# Patient Record
Sex: Female | Born: 1965
Health system: Southern US, Community
[De-identification: ages and names within clinical notes are randomized; demographics above are authoritative.]

## PROBLEM LIST (undated history)

## (undated) DIAGNOSIS — J40 Bronchitis, not specified as acute or chronic: Secondary | ICD-10-CM

## (undated) DIAGNOSIS — J069 Acute upper respiratory infection, unspecified: Secondary | ICD-10-CM

## (undated) DIAGNOSIS — J45909 Unspecified asthma, uncomplicated: Secondary | ICD-10-CM

## (undated) DIAGNOSIS — F4310 Post-traumatic stress disorder, unspecified: Secondary | ICD-10-CM

## (undated) DIAGNOSIS — G43909 Migraine, unspecified, not intractable, without status migrainosus: Secondary | ICD-10-CM

## (undated) HISTORY — PX: ABDOMINAL HYSTERECTOMY: SHX81

## (undated) HISTORY — DX: Unspecified asthma, uncomplicated: J45.909

## (undated) HISTORY — DX: Acute upper respiratory infection, unspecified: J06.9

---

## 2013-02-10 ENCOUNTER — Encounter: Payer: Federal, State, Local not specified - PPO | Attending: Internal Medicine | Admitting: Dietician

## 2013-02-10 ENCOUNTER — Encounter: Payer: Self-pay | Admitting: Dietician

## 2013-02-10 VITALS — Ht 65.0 in | Wt 206.8 lb

## 2013-02-10 DIAGNOSIS — Z713 Dietary counseling and surveillance: Secondary | ICD-10-CM | POA: Insufficient documentation

## 2013-02-10 DIAGNOSIS — E669 Obesity, unspecified: Secondary | ICD-10-CM | POA: Insufficient documentation

## 2013-02-10 NOTE — Progress Notes (Signed)
  Medical Nutrition Therapy:  Appt start time: 0800 end time:  0900.   Assessment:  Primary concerns today: Tiffany Benson is here today since her blood sugar was elevated at her yearly Ob/gyn appointment.   Tiffany Benson is working at [employer] from 3 PM - 12 AM. and lives by herself and states that she does her grocery shopping and cooking.   Since August, Tiffany Benson has  made changes to diet such no longer drinking sweet tea, fried foods, and french fries. She states she lost two pounds since her doctor's appointment in August.   MEDICATIONS: See List    DIETARY INTAKE:  24-hr recall:  B ( AM): skips  Snk ( AM): None L ( 12 PM): water and peanut butter crackers Snk ( 3 PM): Grilled chicken wrap with water D ( 8 PM): Grilled chicken salad from [employer] with water Snk ( 12 AM): popcorn and [place] Green Tea Beverages: Water and 1 sweet green tea  Usual physical activity: walks a lot for her job  Estimated energy needs: 1800 calories 200 g carbohydrates 135 g protein 50 g fat  Progress Towards Goal(s):  In progress.   Nutritional Diagnosis:  NB-1.1 Food and nutrition-related knowledge deficit As related to history of high fat and high CHO food choices.  As evidenced by diet recall and BMI of 34.4.    Intervention:  Nutrition counseling provided. Explained the pathophysiology of insulin resistance and factors that impact blood glucose such as carbohydrate foods, exercise, stress management, and weight loss. Encouraged Tiffany Benson to continue with the dietary changes she made since August, switch her sweet green tea to a sugar free beverage, watch portion sizes, add vegetables when possible, and start exercising before work.   Handouts given during visit include:  MyPlate Handout  16X CHO Snacks  Monitoring/Evaluation:  Dietary intake, exercise, and body weight prn.

## 2013-02-10 NOTE — Patient Instructions (Addendum)
Continue having 3 meals per day and add 2-3 snacks with protein and carbs if needed. Consider switching to no-calorie sweeteners for water instead of green tea. Fill half of your plate with vegetables and limit starch to a quarter of your plate.  Aim to get 30 minutes of physical activity 5 x week. Look for ways to manage stress (if possible).

## 2013-07-11 ENCOUNTER — Ambulatory Visit (INDEPENDENT_AMBULATORY_CARE_PROVIDER_SITE_OTHER): Payer: Federal, State, Local not specified - PPO | Admitting: Internal Medicine

## 2013-07-11 ENCOUNTER — Ambulatory Visit: Payer: Federal, State, Local not specified - PPO

## 2013-07-11 ENCOUNTER — Encounter: Payer: Self-pay | Admitting: Emergency Medicine

## 2013-07-11 VITALS — BP 140/90 | HR 110 | Temp 98.0°F | Resp 20 | Ht 65.5 in | Wt 205.0 lb

## 2013-07-11 DIAGNOSIS — J45901 Unspecified asthma with (acute) exacerbation: Secondary | ICD-10-CM

## 2013-07-11 DIAGNOSIS — J988 Other specified respiratory disorders: Secondary | ICD-10-CM

## 2013-07-11 DIAGNOSIS — R0602 Shortness of breath: Secondary | ICD-10-CM

## 2013-07-11 DIAGNOSIS — J22 Unspecified acute lower respiratory infection: Secondary | ICD-10-CM

## 2013-07-11 MED ORDER — AZITHROMYCIN 250 MG PO TABS: ORAL_TABLET | ORAL | Status: DC

## 2013-07-11 MED ORDER — IPRATROPIUM BROMIDE 0.02 % IN SOLN
0.5000 mg | Freq: Once | RESPIRATORY_TRACT | Status: AC
  Administered 2013-07-11: 0.5 mg via RESPIRATORY_TRACT

## 2013-07-11 MED ORDER — ALBUTEROL SULFATE (2.5 MG/3ML) 0.083% IN NEBU
2.5000 mg | INHALATION_SOLUTION | Freq: Once | RESPIRATORY_TRACT | Status: AC
  Administered 2013-07-11: 2.5 mg via RESPIRATORY_TRACT

## 2013-07-11 MED ORDER — PREDNISONE 20 MG PO TABS: 40.0000 mg | ORAL_TABLET | Freq: Every day | ORAL | Status: DC

## 2013-07-11 NOTE — Progress Notes (Signed)
   Subjective:    Patient ID: Tiffany Benson, female    DOB: 01/18/1966, 48 y.o.   MRN: [id number]  HPI 48 yo female with complaint of shortness of breath.  Onset today.  Positive recent URI/cough symptoms for past 3-4 days.  She has history of adult onset asthma (2006) currently on advair and albuterol.  Used meds this am, but no albuteral since that time.  No fever or chills.  Cough productive with greenish/yellow sputum.  No chest pain.  PPMH:  Asthma  SH:  Nonsmoker, no alcohol  FH:  Noncontributory.   Review of Systems  Constitutional: Negative for fever and chills.  HENT: Positive for sore throat. Negative for rhinorrhea.   Respiratory: Positive for cough, shortness of breath and wheezing. Negative for chest tightness and stridor.   Cardiovascular: Negative for chest pain, palpitations and leg swelling.  Gastrointestinal: Negative for nausea, vomiting, diarrhea and constipation.  Musculoskeletal: Negative for back pain.  Neurological: Negative for syncope, weakness, numbness and headaches.       Objective:   Physical Exam Blood pressure 140/90, pulse 110, temperature 98 F (36.7 C), temperature source Oral, resp. rate 20, height 5' 5.5" (1.664 m), weight 205 lb (92.987 kg), SpO2 99.00%. Body mass index is 33.58 kg/(m^2). Well-developed, well nourished female who is awake, alert and oriented, in mild distress. HEENT: [place]/AT, PERRL, EOMI.  Sclera and conjunctiva are clear.  Nasal mucosa is pink and moist. OP is clear. Neck: supple, non-tender, no lymphadenopathy, thyromegaly. Heart: tachycardic, no murmur Lungs: labored breathing with expiratory wheezing. Abdomen: normo-active bowel sounds, supple, non-tender, no mass or organomegaly. Extremities: no cyanosis, clubbing or edema. Skin: warm and dry without rash. Psychologic: good mood and appropriate affect, normal speech and behavior.  Chest xray negative EKG:  Mild tachycardia (done after neb), normal axis, normal  intervals, nonspecific st t changes., no acute ischemia     Assessment & Plan:  Patient given albuterol/atrovent neb on arrival. Re-eval and wheezing resolved after neb.  Patient feeling better.    Asthmatic bronchitis with lower respiratory tract infection  D/C on Zpack and 5 days of prednisone.   I have reviewed this patient presentation and the x-ray and agree with plan and documentation. Robert P. Merla Richesoolittle, M.D.

## 2013-07-11 NOTE — Patient Instructions (Signed)

## 2013-09-27 ENCOUNTER — Emergency Department (HOSPITAL_BASED_OUTPATIENT_CLINIC_OR_DEPARTMENT_OTHER)
Admission: EM | Admit: 2013-09-27 | Discharge: 2013-09-27 | Disposition: A | Discharge status: Home / Self Care | Attending: Emergency Medicine | Admitting: Emergency Medicine

## 2013-09-27 ENCOUNTER — Encounter (HOSPITAL_BASED_OUTPATIENT_CLINIC_OR_DEPARTMENT_OTHER): Payer: Self-pay | Admitting: Emergency Medicine

## 2013-09-27 DIAGNOSIS — S335XXA Sprain of ligaments of lumbar spine, initial encounter: Secondary | ICD-10-CM | POA: Diagnosis not present

## 2013-09-27 DIAGNOSIS — S239XXA Sprain of unspecified parts of thorax, initial encounter: Secondary | ICD-10-CM | POA: Insufficient documentation

## 2013-09-27 DIAGNOSIS — J45909 Unspecified asthma, uncomplicated: Secondary | ICD-10-CM | POA: Diagnosis not present

## 2013-09-27 DIAGNOSIS — Z79899 Other long term (current) drug therapy: Secondary | ICD-10-CM | POA: Insufficient documentation

## 2013-09-27 DIAGNOSIS — X500XXA Overexertion from strenuous movement or load, initial encounter: Secondary | ICD-10-CM | POA: Insufficient documentation

## 2013-09-27 DIAGNOSIS — IMO0002 Reserved for concepts with insufficient information to code with codable children: Secondary | ICD-10-CM | POA: Diagnosis not present

## 2013-09-27 DIAGNOSIS — Y990 Civilian activity done for income or pay: Secondary | ICD-10-CM | POA: Diagnosis not present

## 2013-09-27 DIAGNOSIS — Y9389 Activity, other specified: Secondary | ICD-10-CM | POA: Diagnosis not present

## 2013-09-27 DIAGNOSIS — Y9289 Other specified places as the place of occurrence of the external cause: Secondary | ICD-10-CM | POA: Diagnosis not present

## 2013-09-27 DIAGNOSIS — S39012A Strain of muscle, fascia and tendon of lower back, initial encounter: Secondary | ICD-10-CM

## 2013-09-27 MED ORDER — KETOROLAC TROMETHAMINE 60 MG/2ML IM SOLN
60.0000 mg | Freq: Once | INTRAMUSCULAR | Status: AC
  Administered 2013-09-27: 60 mg via INTRAMUSCULAR
  Filled 2013-09-27: qty 2

## 2013-09-27 MED ORDER — HYDROCODONE-ACETAMINOPHEN 5-325 MG PO TABS: 1.0000 | ORAL_TABLET | ORAL | Status: DC | PRN

## 2013-09-27 NOTE — ED Notes (Signed)
C/o of mid back pain after lifting something at work,   [place] 2 ibu but no relief

## 2013-09-27 NOTE — Discharge Instructions (Signed)
Ibuprofen 600 mg 3 times daily for the next 5 days. Hydrocodone as needed for pain not relieved with ibuprofen.  Followup with your primary Dr. if not improving in the next week, and return to the ER if your pain significantly worsens or changes in nature.   Back Pain, Adult Low back pain is very common. About 1 in 5 people have back pain.The cause of low back pain is rarely dangerous. The pain often gets better over time.About half of people with a sudden onset of back pain feel better in just 2 weeks. About 8 in 10 people feel better by 6 weeks.  CAUSES Some common causes of back pain include:  Strain of the muscles or ligaments supporting the spine.  Wear and tear (degeneration) of the spinal discs.  Arthritis.  Direct injury to the back. DIAGNOSIS Most of the time, the direct cause of low back pain is not known.However, back pain can be treated effectively even when the exact cause of the pain is unknown.Answering your caregiver's questions about your overall health and symptoms is one of the most accurate ways to make sure the cause of your pain is not dangerous. If your caregiver needs more information, he or she may order lab work or imaging tests (X-rays or MRIs).However, even if imaging tests show changes in your back, this usually does not require surgery. HOME CARE INSTRUCTIONS For many people, back pain returns.Since low back pain is rarely dangerous, it is often a condition that people can learn to [place] their own.   Remain active. It is stressful on the back to sit or stand in one place. Do not sit, drive, or stand in one place for more than 30 minutes at a time. Take short walks on level surfaces as soon as pain allows.Try to increase the length of time you walk each day.  Do not stay in bed.Resting more than 1 or 2 days can delay your recovery.  Do not avoid exercise or work.Your body is made to move.It is not dangerous to be active, even though your back may  hurt.Your back will likely heal faster if you return to being active before your pain is gone.  Pay attention to your body when you bend and lift. Many people have less discomfortwhen lifting if they bend their knees, keep the load close to their bodies,and avoid twisting. Often, the most comfortable positions are those that put less stress on your recovering back.  Find a comfortable position to sleep. Use a firm mattress and lie on your side with your knees slightly bent. If you lie on your back, put a pillow under your knees.  Only take over-the-counter or prescription medicines as directed by your caregiver. Over-the-counter medicines to reduce pain and inflammation are often the most helpful.Your caregiver may prescribe muscle relaxant drugs.These medicines help dull your pain so you can more quickly return to your normal activities and healthy exercise.  Put ice on the injured area.  Put ice in a plastic bag.  Place a towel between your skin and the bag.  Leave the ice on for 15-20 minutes, 03-04 times a day for the first 2 to 3 days. After that, ice and heat may be alternated to reduce pain and spasms.  Ask your caregiver about trying back exercises and gentle massage. This may be of some benefit.  Avoid feeling anxious or stressed.Stress increases muscle tension and can worsen back pain.It is important to recognize when you are anxious or stressed and  learn ways to manage it.Exercise is a great option. SEEK MEDICAL CARE IF:  You have pain that is not relieved with rest or medicine.  You have pain that does not improve in 1 week.  You have new symptoms.  You are generally not feeling well. SEEK IMMEDIATE MEDICAL CARE IF:   You have pain that radiates from your back into your legs.  You develop new bowel or bladder control problems.  You have unusual weakness or numbness in your arms or legs.  You develop nausea or vomiting.  You develop abdominal pain.  You  feel faint. Document Released: 05/18/2005 Document Revised: 11/17/2011 Document Reviewed: 10/06/2010 [facility] Patient Information 2014 [place], [place].

## 2013-09-27 NOTE — ED Provider Notes (Signed)
CSN: [id number]     Arrival date & time 09/27/13  1910 History  This chart was scribed for Geoffery Lyonsouglas Christion Leonhard, MD by Luisa DagoPriscilla Tutu, ED Scribe. This patient was seen in room MH11/MH11 and the patient's care was started at 7:40 PM.    Chief Complaint  Patient presents with  . Back Pain   The history is provided by the patient. No language interpreter was used.   HPI Comments: Tiffany Benson is a 48 y.o. female with history of asthma and seasonal allergies, presents to the Emergency Department complaining of worsening sudden onset back pain that started about 3 hours ago. Pt states that she was picking up heavy items at work when she started experiencing back pain. Pt is concerned that she may have pulled a muscle. Mrs. Tiffany Benson says that the pain is exacerbated by bending down or twisting. She denies any history of back pain. Pt denies any pain radiating down her legs. She denies any saddle paraesthesia, numbness, weakness, SOB, bladder or bowel incontinence.   Pt works for the [employer].   Past Medical History  Diagnosis Date  . Asthma    History reviewed. No pertinent past surgical history. Family History  Problem Relation Age of Onset  . Hypertension Other    History  Substance Use Topics  . Smoking status: Never Smoker   . Smokeless tobacco: Not on file  . Alcohol Use: No   OB History   Grav Para Term Preterm Abortions TAB SAB Ect Mult Living                 Review of Systems A complete 10 system review of systems was obtained and all systems are negative except as noted in the HPI and PMH.     Allergies  Review of patient's allergies indicates no known allergies.  Home Medications   Prior to Admission medications   Medication Sig Start Date End Date Taking? Authorizing Provider  albuterol (PROVENTIL HFA;VENTOLIN HFA) 108 (90 BASE) MCG/ACT inhaler Inhale 2 puffs into the lungs every 6 (six) hours as needed for wheezing.    Historical Provider, MD  azithromycin  (ZITHROMAX) 250 MG tablet Take 2 tabs PO x 1 dose, then 1 tab PO QD x 4 days 07/11/13   Tarry Kosodd M McGrath, MD  Fluticasone-Salmeterol (ADVAIR) 100-50 MCG/DOSE AEPB Inhale 1 puff into the lungs every 12 (twelve) hours.    Historical Provider, MD  predniSONE (DELTASONE) 20 MG tablet Take 2 tablets (40 mg total) by mouth daily with breakfast. 07/11/13   Tarry Kosodd M McGrath, MD   BP 136/87  Pulse 93  Temp(Src) 97.2 F (36.2 C) (Oral)  Resp 18  Ht 5\' 3"  (1.6 m)  Wt 203 lb (92.08 kg)  BMI 35.97 kg/m2  SpO2 98%  Physical Exam  Nursing note and vitals reviewed. Constitutional: She appears well-developed and well-nourished. No distress.  HENT:  Head: Normocephalic and atraumatic.  Eyes: Conjunctivae are normal. Right eye exhibits no discharge. Left eye exhibits no discharge.  Neck: Neck supple.  Cardiovascular: Normal rate, regular rhythm and normal heart sounds.  Exam reveals no gallop and no friction rub.   No murmur heard. Pulmonary/Chest: Effort normal and breath sounds normal. No respiratory distress.  Abdominal: Soft. She exhibits no distension. There is no tenderness.  Musculoskeletal: She exhibits no edema and no tenderness.  TTP in the soft tissues of the upper lumbar/ lower thoracic region. There is no bony tenderness or step-offs.  Neurological: She is alert.  DTRs normal  and symmetric. Equal grip strength bilateral with 5/5 strength against resistance in upper and lower extremities. No sensory or motor deficits appreciated. Able to walk on heels and toes without difficulty.    Skin: Skin is warm and dry.  Psychiatric: She has a normal mood and affect. Her behavior is normal. Thought content normal.    ED Course  Procedures (including critical care time)  DIAGNOSTIC STUDIES: Oxygen Saturation is 98% on RA, normal by my interpretation.    COORDINATION OF CARE: 7:47 PM- Will prescribe antiinflammatory medication. Will also prescribe pain medication. Pt advised of plan for treatment and  pt agrees. Medications  ketorolac (TORADOL) injection 60 mg (not administered)    Labs Review Labs Reviewed - No data to display  Imaging Review No results found.   EKG Interpretation None      MDM   Final diagnoses:  None    Patient is a 48 year old female who presents with complaints of pain in the mid back that started abruptly 3 hours ago while lifting a heavy item at work. She works for the [employer]. She denies radiation into the legs and denies any bowel or bladder complaints. Her neurologic exam is nonfocal, strength is symmetrical, and she is able to ambulate without difficulty. She will be discharged home with anti-inflammatories, pain medication and when necessary followup if she is not improving in the next week.  I personally performed the services described in this documentation, which was scribed in my presence. The recorded information has been reviewed and is accurate.    Geoffery Lyonsouglas Benicia Bergevin, MD 09/27/13 [id number]

## 2013-09-27 NOTE — ED Notes (Signed)
Pt c/o picking up heavy items at work and suddenly got mid back pain x 3 hrs ago

## 2014-01-11 ENCOUNTER — Other Ambulatory Visit (HOSPITAL_COMMUNITY)
Admission: RE | Admit: 2014-01-11 | Discharge: 2014-01-11 | Disposition: A | Discharge status: Home / Self Care | Payer: Federal, State, Local not specified - PPO | Source: Ambulatory Visit | Attending: Obstetrics and Gynecology | Admitting: Obstetrics and Gynecology

## 2014-01-11 ENCOUNTER — Other Ambulatory Visit: Payer: Self-pay | Admitting: Obstetrics and Gynecology

## 2014-01-11 DIAGNOSIS — Z1151 Encounter for screening for human papillomavirus (HPV): Secondary | ICD-10-CM | POA: Insufficient documentation

## 2014-01-11 DIAGNOSIS — Z01419 Encounter for gynecological examination (general) (routine) without abnormal findings: Secondary | ICD-10-CM | POA: Insufficient documentation

## 2014-01-15 LAB — CYTOLOGY - PAP

## 2014-06-13 ENCOUNTER — Ambulatory Visit (HOSPITAL_COMMUNITY)
Admission: RE | Admit: 2014-06-13 | Payer: Federal, State, Local not specified - PPO | Source: Ambulatory Visit | Admitting: Obstetrics and Gynecology

## 2014-06-13 ENCOUNTER — Encounter (HOSPITAL_COMMUNITY): Admission: RE | Payer: Self-pay | Source: Ambulatory Visit

## 2014-06-13 SURGERY — ROBOTIC ASSISTED TOTAL HYSTERECTOMY WITH BILATERAL SALPINGO OOPHORECTOMY
Anesthesia: Choice | Laterality: Bilateral

## 2015-01-21 ENCOUNTER — Other Ambulatory Visit (HOSPITAL_COMMUNITY): Payer: Self-pay

## 2015-01-30 ENCOUNTER — Encounter (HOSPITAL_COMMUNITY): Admission: RE | Payer: Self-pay | Source: Ambulatory Visit

## 2015-01-30 ENCOUNTER — Ambulatory Visit (HOSPITAL_COMMUNITY)
Admission: RE | Admit: 2015-01-30 | Payer: Federal, State, Local not specified - PPO | Source: Ambulatory Visit | Admitting: Obstetrics and Gynecology

## 2015-01-30 SURGERY — ROBOTIC ASSISTED TOTAL HYSTERECTOMY WITH BILATERAL SALPINGO OOPHORECTOMY
Anesthesia: Choice | Laterality: Bilateral

## 2016-02-21 ENCOUNTER — Encounter (HOSPITAL_BASED_OUTPATIENT_CLINIC_OR_DEPARTMENT_OTHER): Payer: Self-pay

## 2016-02-21 ENCOUNTER — Emergency Department (HOSPITAL_BASED_OUTPATIENT_CLINIC_OR_DEPARTMENT_OTHER): Payer: Federal, State, Local not specified - PPO

## 2016-02-21 ENCOUNTER — Emergency Department (HOSPITAL_BASED_OUTPATIENT_CLINIC_OR_DEPARTMENT_OTHER)
Admission: EM | Admit: 2016-02-21 | Discharge: 2016-02-21 | Disposition: A | Discharge status: Home / Self Care | Payer: Federal, State, Local not specified - PPO | Attending: Emergency Medicine | Admitting: Emergency Medicine

## 2016-02-21 DIAGNOSIS — Y92481 Parking lot as the place of occurrence of the external cause: Secondary | ICD-10-CM | POA: Insufficient documentation

## 2016-02-21 DIAGNOSIS — Z23 Encounter for immunization: Secondary | ICD-10-CM | POA: Insufficient documentation

## 2016-02-21 DIAGNOSIS — S20211A Contusion of right front wall of thorax, initial encounter: Secondary | ICD-10-CM | POA: Diagnosis not present

## 2016-02-21 DIAGNOSIS — Y999 Unspecified external cause status: Secondary | ICD-10-CM | POA: Insufficient documentation

## 2016-02-21 DIAGNOSIS — J45909 Unspecified asthma, uncomplicated: Secondary | ICD-10-CM | POA: Diagnosis not present

## 2016-02-21 DIAGNOSIS — Y939 Activity, unspecified: Secondary | ICD-10-CM | POA: Insufficient documentation

## 2016-02-21 DIAGNOSIS — S299XXA Unspecified injury of thorax, initial encounter: Secondary | ICD-10-CM | POA: Diagnosis present

## 2016-02-21 MED ORDER — TETANUS-DIPHTH-ACELL PERTUSSIS 5-2.5-18.5 LF-MCG/0.5 IM SUSP
0.5000 mL | Freq: Once | INTRAMUSCULAR | Status: AC
  Administered 2016-02-21: 0.5 mL via INTRAMUSCULAR
  Filled 2016-02-21: qty 0.5
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
## 2016-02-21 NOTE — ED Notes (Signed)
Patient complains of pain in chest with movement and coughing.  Denies SOB or difficulty breathing.  Complains of right knee pain with mild swelling noted.  Patient is weight bearing without difficulty.  NAD.

## 2016-02-21 NOTE — ED Notes (Signed)
Pt denies wheelchair at triage
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
## 2016-08-31 ENCOUNTER — Encounter (HOSPITAL_BASED_OUTPATIENT_CLINIC_OR_DEPARTMENT_OTHER): Payer: Self-pay | Admitting: Emergency Medicine

## 2016-08-31 ENCOUNTER — Emergency Department (HOSPITAL_BASED_OUTPATIENT_CLINIC_OR_DEPARTMENT_OTHER)
Admission: EM | Admit: 2016-08-31 | Discharge: 2016-09-01 | Disposition: A | Discharge status: Home / Self Care | Payer: Federal, State, Local not specified - PPO | Attending: Emergency Medicine | Admitting: Emergency Medicine

## 2016-08-31 ENCOUNTER — Emergency Department (HOSPITAL_BASED_OUTPATIENT_CLINIC_OR_DEPARTMENT_OTHER): Payer: Federal, State, Local not specified - PPO

## 2016-08-31 DIAGNOSIS — B9789 Other viral agents as the cause of diseases classified elsewhere: Secondary | ICD-10-CM

## 2016-08-31 DIAGNOSIS — G43009 Migraine without aura, not intractable, without status migrainosus: Secondary | ICD-10-CM

## 2016-08-31 DIAGNOSIS — R0602 Shortness of breath: Secondary | ICD-10-CM

## 2016-08-31 DIAGNOSIS — J45909 Unspecified asthma, uncomplicated: Secondary | ICD-10-CM | POA: Diagnosis not present

## 2016-08-31 DIAGNOSIS — J069 Acute upper respiratory infection, unspecified: Secondary | ICD-10-CM

## 2016-08-31 DIAGNOSIS — G43909 Migraine, unspecified, not intractable, without status migrainosus: Secondary | ICD-10-CM | POA: Insufficient documentation

## 2016-08-31 MED ORDER — METOCLOPRAMIDE HCL 5 MG/ML IJ SOLN
10.0000 mg | Freq: Once | INTRAMUSCULAR | Status: AC
Start: 2016-08-31 — End: 2016-08-31
  Administered 2016-08-31: 10 mg via INTRAVENOUS
  Filled 2016-08-31: qty 2

## 2016-08-31 MED ORDER — DIPHENHYDRAMINE HCL 50 MG/ML IJ SOLN
25.0000 mg | Freq: Once | INTRAMUSCULAR | Status: AC
  Administered 2016-08-31: 25 mg via INTRAVENOUS
  Filled 2016-08-31: qty 1

## 2016-08-31 MED ORDER — DEXAMETHASONE SODIUM PHOSPHATE 10 MG/ML IJ SOLN
10.0000 mg | Freq: Once | INTRAMUSCULAR | Status: AC
  Administered 2016-08-31: 10 mg via INTRAVENOUS
  Filled 2016-08-31: qty 1

## 2016-08-31 MED ORDER — KETOROLAC TROMETHAMINE 30 MG/ML IJ SOLN
30.0000 mg | Freq: Once | INTRAMUSCULAR | Status: AC
  Administered 2016-08-31: 30 mg via INTRAVENOUS
  Filled 2016-08-31: qty 1

## 2016-08-31 MED ORDER — SODIUM CHLORIDE 0.9 % IV BOLUS (SEPSIS)
1000.0000 mL | Freq: Once | INTRAVENOUS | Status: AC
  Administered 2016-08-31: 1000 mL via INTRAVENOUS

## 2016-08-31 NOTE — ED Notes (Addendum)
Pt states she feels short of breath that started around 1400 this date. Pt reports a hx of asthma and used her inhaler but states it didn't do her any good. Pt reports that she feels as though she has nasal congestion.

## 2016-08-31 NOTE — ED Notes (Signed)
Patient transported to X-ray 

## 2016-08-31 NOTE — ED Provider Notes (Signed)
[facility] Provider Note   CSN: [id number] Arrival date & time: 08/31/16  1825  By signing my name below, I, Linna Darner, attest that this documentation has been prepared under the direction and in the presence of physician practitioner, Lyndal Pulley, MD. Electronically Signed: Linna Darner, Scribe. 08/31/2016. 9:20 PM.  History   Chief Complaint Chief Complaint  Patient presents with  . Migraine  . Shortness of Breath    The history is provided by the patient. No language interpreter was used.  Shortness of Breath  This is a recurrent problem. The average episode lasts 7 hours. The problem occurs frequently.The current episode started 6 to 12 hours ago. The problem has not changed since onset.Associated symptoms include headaches, cough and chest pain (secondary to cough). Pertinent negatives include no sore throat, no sputum production, no hemoptysis, no vomiting and no abdominal pain. It is unknown what precipitated the problem. She has tried inhaled steroids for the symptoms. The treatment provided no relief. Associated medical issues include asthma. Associated medical issues do not include COPD, pneumonia, chronic lung disease, PE, CAD, heart failure, past MI, DVT or recent surgery.     HPI Comments: Tiffany Benson is a 51 y.o. female with PMHx of asthma who presents to the Emergency Department complaining of persistent shortness of breath beginning this afternoon. Pt notes she has had a gradually worsening non-productive cough for a few days along with chest pain secondary to coughing. She states her symptoms are consistent with past asthma exacerbations. Pt has used her inhaler with no improvement of her symptoms. She has also tried some OTC medications for her cough with no relief. She states she came here to receive a breathing treatment which has provided good relief of her asthma exacerbations in the past. She denies congestion, sore throat, or any other associated  symptoms.  She is also complaining of a constant headache beginning this afternoon. She endorses associated photophobia. Pt states her symptoms are consistent with her h/o chronic migraines. No medications or treatments tried. She denies vision changes, nausea, vomiting, or any other associated symptoms.  Past Medical History:  Diagnosis Date  . Asthma     There are no active problems to display for this patient.   Past Surgical History:  Procedure Laterality Date  . ABDOMINAL HYSTERECTOMY      OB History    No data available       Home Medications    Prior to Admission medications   Medication Sig Start Date End Date Taking? Authorizing Provider  acetaminophen (TYLENOL) 500 MG tablet Take 500 mg by mouth every 6 (six) hours as needed (cramps).    Historical Provider, MD  albuterol (PROVENTIL HFA;VENTOLIN HFA) 108 (90 BASE) MCG/ACT inhaler Inhale 2 puffs into the lungs every 6 (six) hours as needed for wheezing.    Historical Provider, MD  Fe Fum-FePoly-Vit C-Vit B3 (INTEGRA) 62.5-62.5-40-3 MG CAPS Take 1 capsule by mouth 2 (two) times daily.    Historical Provider, MD  zolpidem (AMBIEN) 10 MG tablet Take 10 mg by mouth at bedtime as needed for sleep.    Historical Provider, MD    Family History Family History  Problem Relation Age of Onset  . Hypertension Other     Social History Social History  Substance Use Topics  . Smoking status: Never Smoker  . Smokeless tobacco: Never Used  . Alcohol use No     Allergies   Patient has no known allergies.   Review of Systems Review  of Systems  HENT: Negative for congestion and sore throat.   Eyes: Negative for visual disturbance.  Respiratory: Positive for cough and shortness of breath. Negative for hemoptysis and sputum production.   Cardiovascular: Positive for chest pain (secondary to cough).  Gastrointestinal: Negative for abdominal pain, nausea and vomiting.  Neurological: Positive for headaches.  All other  systems reviewed and are negative.  Physical Exam Updated Vital Signs BP 136/74   Pulse (!) 102   Temp 99 F (37.2 C) (Oral)   Resp (!) 24   Ht  (1.651 m)   Wt 215 lb (97.5 kg)   SpO2 100%   BMI 35.78 kg/m   Physical Exam  Constitutional: She is oriented to person, place, and time. She appears well-developed and well-nourished. No distress.  HENT:  Head: Normocephalic.  Nose: Nose normal.  Eyes: Conjunctivae are normal.  Neck: Neck supple. No tracheal deviation present.  Cardiovascular: Normal rate, regular rhythm and normal heart sounds.  Exam reveals no gallop and no friction rub.   No murmur heard. Pulmonary/Chest: Effort normal and breath sounds normal. No respiratory distress. She has no decreased breath sounds. She has no wheezes. She has no rhonchi. She has no rales.  Abdominal: Soft. She exhibits no distension.  Neurological: She is alert and oriented to person, place, and time.  Skin: Skin is warm and dry.  Psychiatric: She has a normal mood and affect.   ED Treatments / Results  Labs (all labs ordered are listed, but only abnormal results are displayed) Labs Reviewed - No data to display  EKG  EKG Interpretation None       Radiology Dg Chest 2 View  Result Date: 08/31/2016 CLINICAL DATA:  Shortness of breath for 1 day EXAM: CHEST  2 VIEW COMPARISON:  February 21, 2016 FINDINGS: The heart size and mediastinal contours are within normal limits. Both lungs are clear. The visualized skeletal structures are unremarkable. IMPRESSION: No active cardiopulmonary disease. Electronically Signed   By: Sherian Rein M.D.   On: 08/31/2016 18:56    Procedures Procedures (including critical care time)  DIAGNOSTIC STUDIES: Oxygen Saturation is 100% on RA, normal by my interpretation.    COORDINATION OF CARE: 9:27 PM Discussed treatment plan with pt at bedside and pt agreed to plan.  Medications Ordered in ED Medications  ketorolac (TORADOL) 30 MG/ML injection  30 mg (30 mg Intravenous Given 08/31/16 2152)  metoCLOPramide (REGLAN) injection 10 mg (10 mg Intravenous Given 08/31/16 2152)  diphenhydrAMINE (BENADRYL) injection 25 mg (25 mg Intravenous Given 08/31/16 2152)  sodium chloride 0.9 % bolus 1,000 mL (0 mLs Intravenous Stopped 08/31/16 2315)  dexamethasone (DECADRON) injection 10 mg (10 mg Intravenous Given 08/31/16 2152)     Initial Impression / Assessment and Plan / ED Course  I have reviewed the triage vital signs and the nursing notes.  Pertinent labs & imaging results that were available during my care of the patient were reviewed by me and considered in my medical decision making (see chart for details).     51 y.o. female presents with c/o typical migraine symptoms and ongoing cough over the last 3-4 days. Cough has precipitated a sternal pain that is worse when coughing. Migraine has followed what appears to be viral URI. She was treated with migraine cocktail with improvement of symptoms and was able to rest comfortably. Has no adventitious lung sounds and negative CXR. Plan to follow up with PCP as needed and return precautions discussed for worsening or new concerning  symptoms.   Final Clinical Impressions(s) / ED Diagnoses   Final diagnoses:  Migraine without aura and without status migrainosus, not intractable  Shortness of breath  Viral URI with cough    New Prescriptions New Prescriptions   No medications on file   I personally performed the services described in this documentation, which was scribed in my presence. The recorded information has been reviewed and is accurate.     Lyndal Pulley, MD 09/01/16 [phone]

## 2016-08-31 NOTE — ED Triage Notes (Signed)
Pt c/o SHOB, onset today; hx of asthma.

## 2016-09-01 NOTE — Discharge Instructions (Signed)

## 2016-09-01 NOTE — ED Provider Notes (Signed)
Blood pressure 121/87, pulse 77, temperature 99 F (37.2 C), temperature source Oral, resp. rate 18, height  (1.651 m), weight 215 lb (97.5 kg), SpO2 100 %.  Assuming care from Dr. Clydene Pugh.  In short, Tiffany Benson is a 51 y.o. female with a chief complaint of Migraine and Shortness of Breath .  Refer to the original H&P for additional details.  The current plan of care is to follow up after medicaiton.  01:31 AM Patient is feeling much better. No HA or SOB currently. Will discharge with PCP follow up and plan for NSAIDs OTC.   At this time, I do not feel there is any life-threatening condition present. I have reviewed and discussed all results (EKG, imaging, lab, urine as appropriate), exam findings with patient. I have reviewed nursing notes and appropriate previous records.  I feel the patient is safe to be discharged home without further emergent workup. Discussed usual and customary return precautions. Patient and family (if present) verbalize understanding and are comfortable with this plan.  Patient will follow-up with their primary care provider. If they do not have a primary care provider, information for follow-up has been provided to them. All questions have been answered.  Alona Bene, MD   Maia Plan, MD 09/01/16 [phone]

## 2018-07-08 ENCOUNTER — Emergency Department (HOSPITAL_BASED_OUTPATIENT_CLINIC_OR_DEPARTMENT_OTHER)
Admission: EM | Admit: 2018-07-08 | Discharge: 2018-07-08 | Disposition: A | Discharge status: Home / Self Care | Payer: Federal, State, Local not specified - PPO | Attending: Emergency Medicine | Admitting: Emergency Medicine

## 2018-07-08 ENCOUNTER — Other Ambulatory Visit: Payer: Self-pay

## 2018-07-08 ENCOUNTER — Encounter (HOSPITAL_BASED_OUTPATIENT_CLINIC_OR_DEPARTMENT_OTHER): Payer: Self-pay | Admitting: Adult Health

## 2018-07-08 DIAGNOSIS — M549 Dorsalgia, unspecified: Secondary | ICD-10-CM | POA: Insufficient documentation

## 2018-07-08 DIAGNOSIS — J45909 Unspecified asthma, uncomplicated: Secondary | ICD-10-CM | POA: Diagnosis not present

## 2018-07-08 DIAGNOSIS — Z79899 Other long term (current) drug therapy: Secondary | ICD-10-CM | POA: Diagnosis not present

## 2018-07-08 DIAGNOSIS — M545 Low back pain: Secondary | ICD-10-CM | POA: Diagnosis not present

## 2018-07-08 HISTORY — DX: Bronchitis, not specified as acute or chronic: J40

## 2018-07-08 HISTORY — DX: Migraine, unspecified, not intractable, without status migrainosus: G43.909

## 2018-07-08 HISTORY — DX: Post-traumatic stress disorder, unspecified: F43.10

## 2018-07-08 LAB — URINALYSIS, ROUTINE W REFLEX MICROSCOPIC
Bilirubin Urine: NEGATIVE
Glucose, UA: NEGATIVE mg/dL
KETONES UR: NEGATIVE mg/dL
Leukocytes, UA: NEGATIVE
NITRITE: NEGATIVE
PROTEIN: NEGATIVE mg/dL
Specific Gravity, Urine: 1.025 (ref 1.005–1.030)
pH: 6 (ref 5.0–8.0)

## 2018-07-08 LAB — URINALYSIS, MICROSCOPIC (REFLEX)

## 2018-07-08 MED ORDER — METHOCARBAMOL 500 MG PO TABS: 1000.0000 mg | ORAL_TABLET | Freq: Four times a day (QID) | ORAL | 0 refills | Status: AC

## 2018-07-08 MED ORDER — NAPROXEN 500 MG PO TABS: 500.0000 mg | ORAL_TABLET | Freq: Two times a day (BID) | ORAL | 0 refills | Status: AC

## 2018-07-08 MED ORDER — KETOROLAC TROMETHAMINE 15 MG/ML IJ SOLN
15.0000 mg | Freq: Once | INTRAMUSCULAR | Status: AC
  Administered 2018-07-08: 15 mg via INTRAMUSCULAR
  Filled 2018-07-08: qty 1

## 2018-07-08 MED FILL — NAPROXEN 500 MG TABLET: 500 | 10 days supply | Qty: 20 | Fill #0

## 2018-07-08 MED FILL — METHOCARBAMOL 500 MG TABLET: 500 | 3 days supply | Qty: 20 | Fill #0

## 2018-07-08 NOTE — ED Notes (Signed)
ED Provider at bedside. 

## 2018-07-08 NOTE — ED Provider Notes (Signed)
[facility] EMERGENCY DEPARTMENT Provider Note   CSN: [id number] Arrival date & time: 07/08/18  0935     History   Chief Complaint Chief Complaint  Patient presents with  . Flank Pain    HPI Tiffany Benson is a 53 y.o. female.  Patient presents with 2-day history of back pain.  Patient states that the pain is in the right middle to lower back.  Pain is worse with palpation and with bending forward.  Patient works for the [employer].  She was taking Tylenol for the pain at first however it has since worsened.  She had difficulty sleeping last night due to the pain.  She denies associated fevers, nausea, vomiting, or diarrhea.  No chest pain or shortness of breath.  No abdominal pain.  She denies any urinary symptoms including dysuria, hematuria, increased frequency urgency.  She does not have history of kidney stones.  The pain does not radiate.  The pain does not travel into her legs. Patient denies warning symptoms of back pain including: fecal incontinence, urinary retention or overflow incontinence, night sweats, waking from sleep with back pain, unexplained fevers or weight loss, h/o cancer, IVDU, recent trauma.        Past Medical History:  Diagnosis Date  . Asthma   . Bronchitis   . Migraines   . PTSD (post-traumatic stress disorder)     There are no active problems to display for this patient.   Past Surgical History:  Procedure Laterality Date  . ABDOMINAL HYSTERECTOMY       OB History   No obstetric history on file.      Home Medications    Prior to Admission medications   Medication Sig Start Date End Date Taking? Authorizing Provider  clonazePAM (KLONOPIN) 2 MG tablet  11/20/16  Yes [provider]  pantoprazole (PROTONIX) 20 MG tablet Take by mouth. 11/06/16  Yes [provider]  acetaminophen (TYLENOL) 500 MG tablet Take 500 mg by mouth every 6 (six) hours as needed (cramps).    [provider]  albuterol  (PROVENTIL HFA;VENTOLIN HFA) 108 (90 BASE) MCG/ACT inhaler Inhale 2 puffs into the lungs every 6 (six) hours as needed for wheezing.    [provider]  Fe Fum-FePoly-Vit C-Vit B3 (INTEGRA) 62.5-62.5-40-3 MG CAPS Take 1 capsule by mouth 2 (two) times daily.    [provider]  zolpidem (AMBIEN) 10 MG tablet Take 10 mg by mouth at bedtime as needed for sleep.    [provider]    Family History Family History  Problem Relation Age of Onset  . Hypertension Other     Social History Social History   Tobacco Use  . Smoking status: Never Smoker  . Smokeless tobacco: Never Used  Substance Use Topics  . Alcohol use: No  . Drug use: No     Allergies   Patient has no known allergies.   Review of Systems Review of Systems  Constitutional: Negative for fever and unexpected weight change.  Gastrointestinal: Negative for constipation.       Negative for fecal incontinence.   Genitourinary: Negative for dysuria, flank pain, hematuria, pelvic pain, vaginal bleeding and vaginal discharge.       Negative for urinary incontinence or retention.  Musculoskeletal: Positive for back pain and myalgias.  Neurological: Negative for weakness and numbness.       Denies saddle paresthesias.     Physical Exam Updated Vital Signs BP 136/87   Pulse 86  Temp 98.4 F (36.9 C) (Oral)   Resp 18   Ht 5\' 5"  (1.651 m)   Wt 102.5 kg   SpO2 98%   BMI 37.61 kg/m   Physical Exam Vitals signs and nursing note reviewed.  Constitutional:      Appearance: She is well-developed.  HENT:     Head: Normocephalic and atraumatic.  Eyes:     General:        Right eye: No discharge.        Left eye: No discharge.     Conjunctiva/sclera: Conjunctivae normal.  Neck:     Musculoskeletal: Normal range of motion and neck supple.  Cardiovascular:     Rate and Rhythm: Normal rate and regular rhythm.     Heart sounds: Normal heart sounds.  Pulmonary:     Effort: Pulmonary effort  is normal.     Breath sounds: Normal breath sounds.  Abdominal:     Palpations: Abdomen is soft.     Tenderness: There is no abdominal tenderness. There is no right CVA tenderness, left CVA tenderness, guarding or rebound.     Hernia: No hernia is present.  Musculoskeletal: Normal range of motion.     Cervical back: She exhibits normal range of motion, no tenderness and no bony tenderness.     Thoracic back: She exhibits tenderness. She exhibits normal range of motion and no bony tenderness.     Lumbar back: She exhibits tenderness and spasm. She exhibits normal range of motion and no bony tenderness.       Back:     Comments: Patient is able to twist at her hips without much pain but when she bends over in forward flexion, this causes the pain to become much worse and she has to sit down.  She is able to walk without difficulty.  Skin:    General: Skin is warm and dry.     Findings: No rash.  Neurological:     General: No focal deficit present.     Mental Status: She is alert. Mental status is at baseline.     Sensory: No sensory deficit.     Motor: No weakness.     Deep Tendon Reflexes: Reflexes are normal and symmetric.     Comments: Normal sensation distal lower extremities.       ED Treatments / Results  Labs (all labs ordered are listed, but only abnormal results are displayed) Labs Reviewed  URINALYSIS, ROUTINE W REFLEX MICROSCOPIC - Abnormal; Notable for the following components:      Result Value   Hgb urine dipstick TRACE (*)    All other components within normal limits  URINALYSIS, MICROSCOPIC (REFLEX) - Abnormal; Notable for the following components:   Bacteria, UA FEW (*)    All other components within normal limits    EKG None  Radiology No results found.  Procedures Procedures (including critical care time)  Medications Ordered in ED Medications  ketorolac (TORADOL) 15 MG/ML injection 15 mg (15 mg Intramuscular Given 07/08/18 1029)     Initial  Impression / Assessment and Plan / ED Course  I have reviewed the triage vital signs and the nursing notes.  Pertinent labs & imaging results that were available during my care of the patient were reviewed by me and considered in my medical decision making (see chart for details).     Patient seen and examined. Work-up initiated. Medications ordered.  Pain is most consistent with musculoskeletal pain given reproducibility with palpation and movement.  Less likely kidney stone or pyelonephritis, however I would like to check a UA.  This is pending.  Will give IM Toradol for pain.  Vital signs reviewed and are as follows: BP 136/87   Pulse 86   Temp 98.4 F (36.9 C) (Oral)   Resp 18   Ht 5\' 5"  (1.651 m)   Wt 102.5 kg   SpO2 98%   BMI 37.61 kg/m   11:17 AM patient stable.  No evidence of red blood cells on microscopic exam.  Discussed with patient that her exam is most consistent right now with musculoskeletal lower back pain.  She does not have any signs and symptoms of kidney stone or pyelonephritis.  She has no abdominal pain on exam.  We discussed signs and symptoms which should cause her to return including development of fever, vomiting, chest or abdominal pain, urinary symptoms.  She verbalizes understanding agrees with plan.  Patient counseled on proper use of muscle relaxant medication.  They were told not to drink alcohol, drive any vehicle, or do any dangerous activities while taking this medication.  Patient verbalized understanding.   Final Clinical Impressions(s) / ED Diagnoses   Final diagnoses:  Musculoskeletal back pain   Patient with back pain, reproducible with palpation and bending.  Urine appears clear without signs of blood or infection.  At this point I do not suspect kidney stone or pyelonephritis.  No neurological deficits. Patient is ambulatory. No warning symptoms of back pain including: fecal incontinence, urinary retention or overflow incontinence, night  sweats, waking from sleep with back pain, unexplained fevers or weight loss, h/o cancer, IVDU, recent trauma. No concern for cauda equina, epidural abscess, or other serious cause of back pain. Conservative measures such as rest, ice/heat and pain medicine indicated with PCP follow-up if no improvement with conservative management.    ED Discharge Orders         Ordered    methocarbamol (ROBAXIN) 500 MG tablet  4 times daily     07/08/18 1113    naproxen (NAPROSYN) 500 MG tablet  2 times daily     07/08/18 1113           Renne Crigler, PA-C 07/08/18 1119    Gwyneth Sprout, MD 07/10/18 [phone]

## 2018-07-08 NOTE — ED Triage Notes (Signed)
Presents with sudden onset of right sided flank pain that began 2 nights ago. She took 2 extra strength tyelenol before coming at 9:30 due to severe right sided flank pain. She denies dysuria, hematuria, urgency and frequency. She has CVA tenderness and reports the pain is unbearable when she lays down at night. She endorses hot flashes.

## 2018-07-08 NOTE — Discharge Instructions (Signed)
Please read and follow all provided instructions.  Your diagnoses today include:  1. Musculoskeletal back pain     Tests performed today include:  Vital signs - see below for your results today  Urine test that did not show any red blood cells in the urine or infection  Medications prescribed:   Robaxin (methocarbamol) - muscle relaxer medication  DO NOT drive or perform any activities that require you to be awake and alert because this medicine can make you drowsy.    Naproxen - anti-inflammatory pain medication  Do not exceed 500mg  naproxen every 12 hours, take with food  You have been prescribed an anti-inflammatory medication or NSAID. Take with food. Take smallest effective dose for the shortest duration needed for your pain. Stop taking if you experience stomach pain or vomiting.   Take any prescribed medications only as directed.  Home care instructions:   Follow any educational materials contained in this packet  Please rest, use ice or heat on your back for the next several days  Do not lift, push, pull anything more than 10 pounds for the next week  Follow-up instructions: Please follow-up with your primary care provider in the next 1 week for further evaluation of your symptoms.   Return instructions:  SEEK IMMEDIATE MEDICAL ATTENTION IF YOU HAVE:  New numbness, tingling, weakness, or problem with the use of your arms or legs  Severe back pain not relieved with medications  Loss control of your bowels or bladder  If you develop abdominal pain or urinary symptoms such as pain with urination or blood in urine  Increasing pain in any areas of the body (such as chest or abdominal pain)  Shortness of breath, dizziness, or fainting.   Worsening nausea (feeling sick to your stomach), vomiting, fever, or sweats  Any other emergent concerns regarding your health   Additional Information:  Your vital signs today were: BP 136/87    Pulse 86    Temp 98.4 F  (36.9 C) (Oral)    Resp 18    Ht 5\' 5"  (1.651 m)    Wt 102.5 kg    SpO2 98%    BMI 37.61 kg/m  If your blood pressure (BP) was elevated above 135/85 this visit, please have this repeated by your doctor within one month. --------------

## 2019-04-01 IMAGING — CR DG CHEST 2V
2 series · 2 of 2 positions shown · non-contrast
Comparison: February 21, 2016

CLINICAL DATA: Shortness of breath for 1 day

EXAM:
CHEST  2 VIEW

[w chest pa]
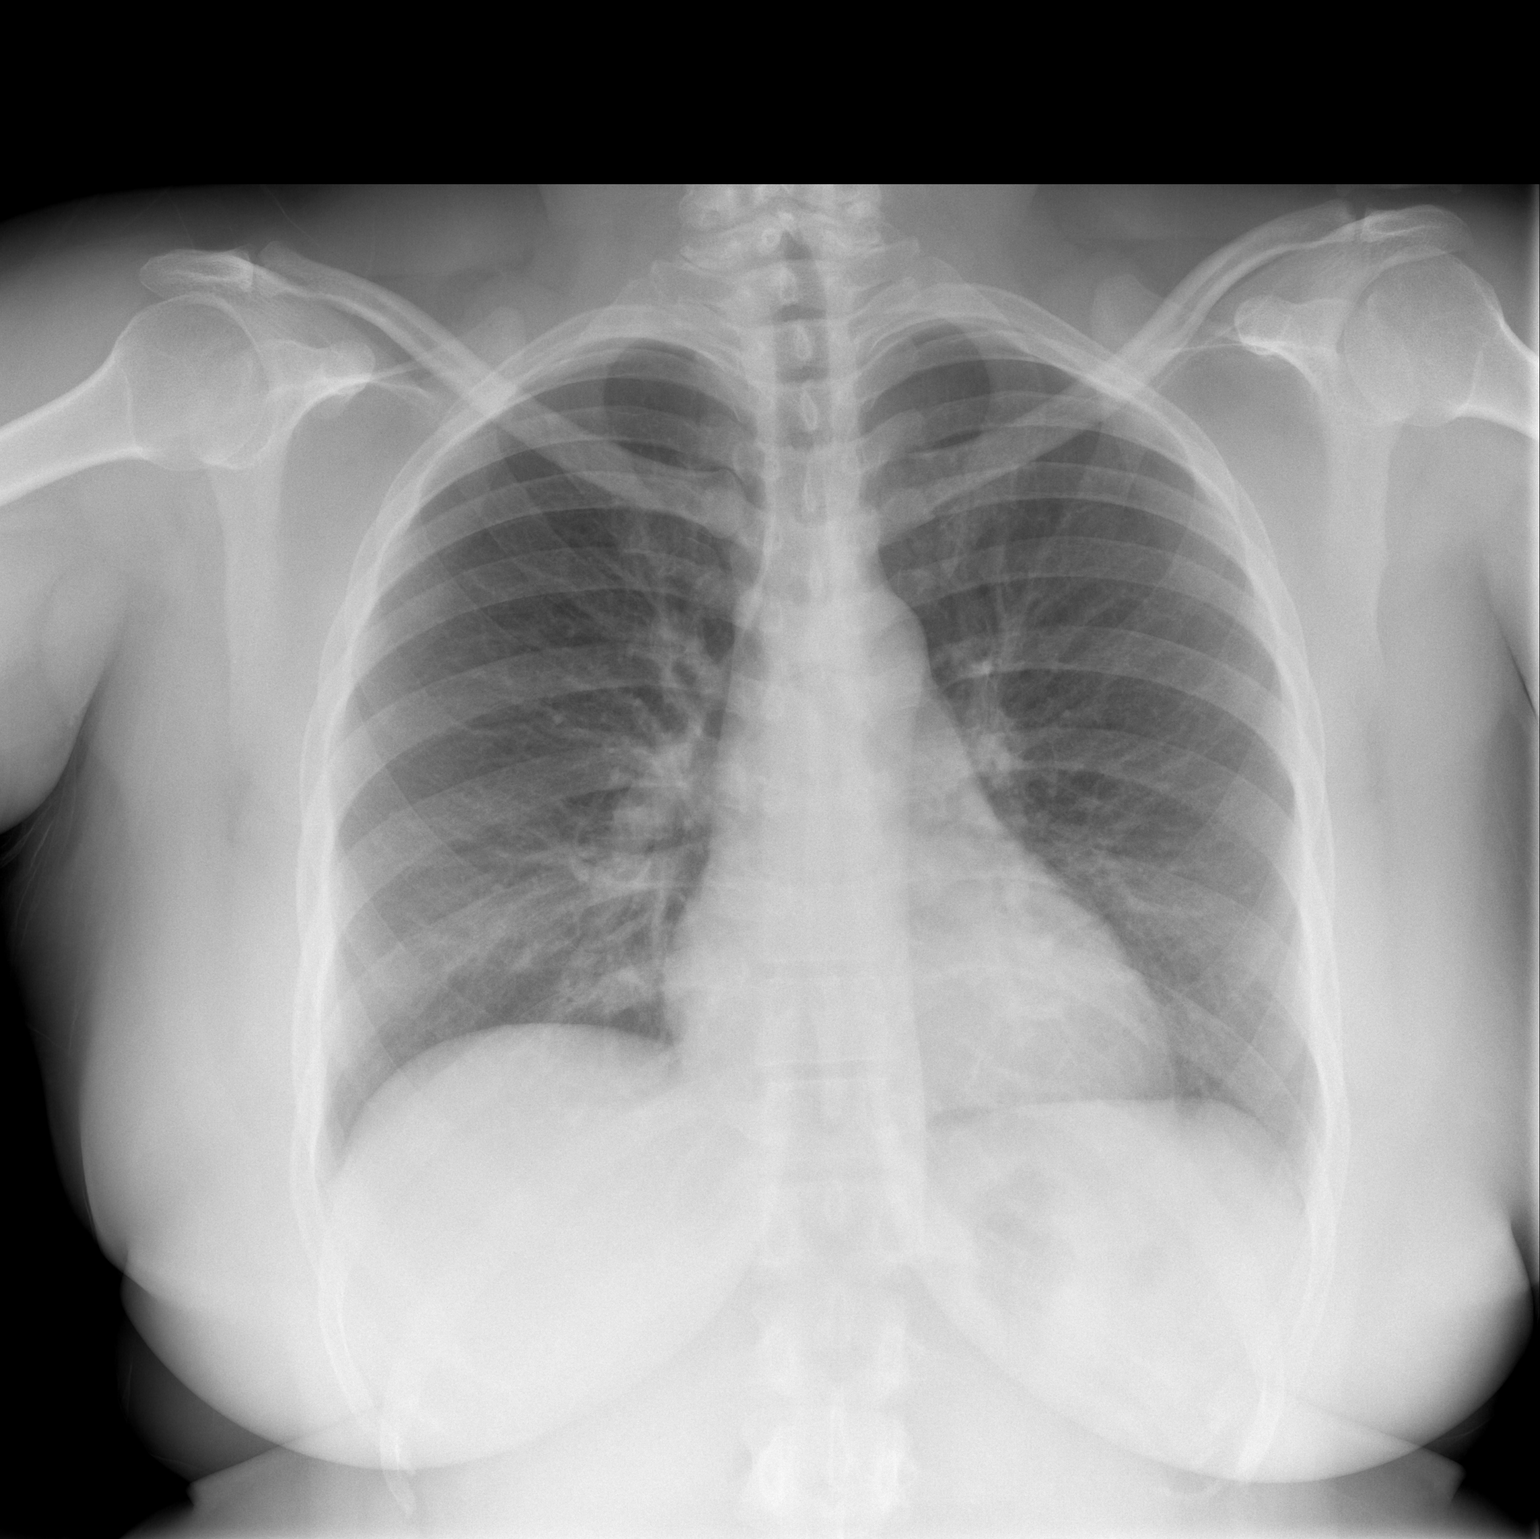

[w chest lat]
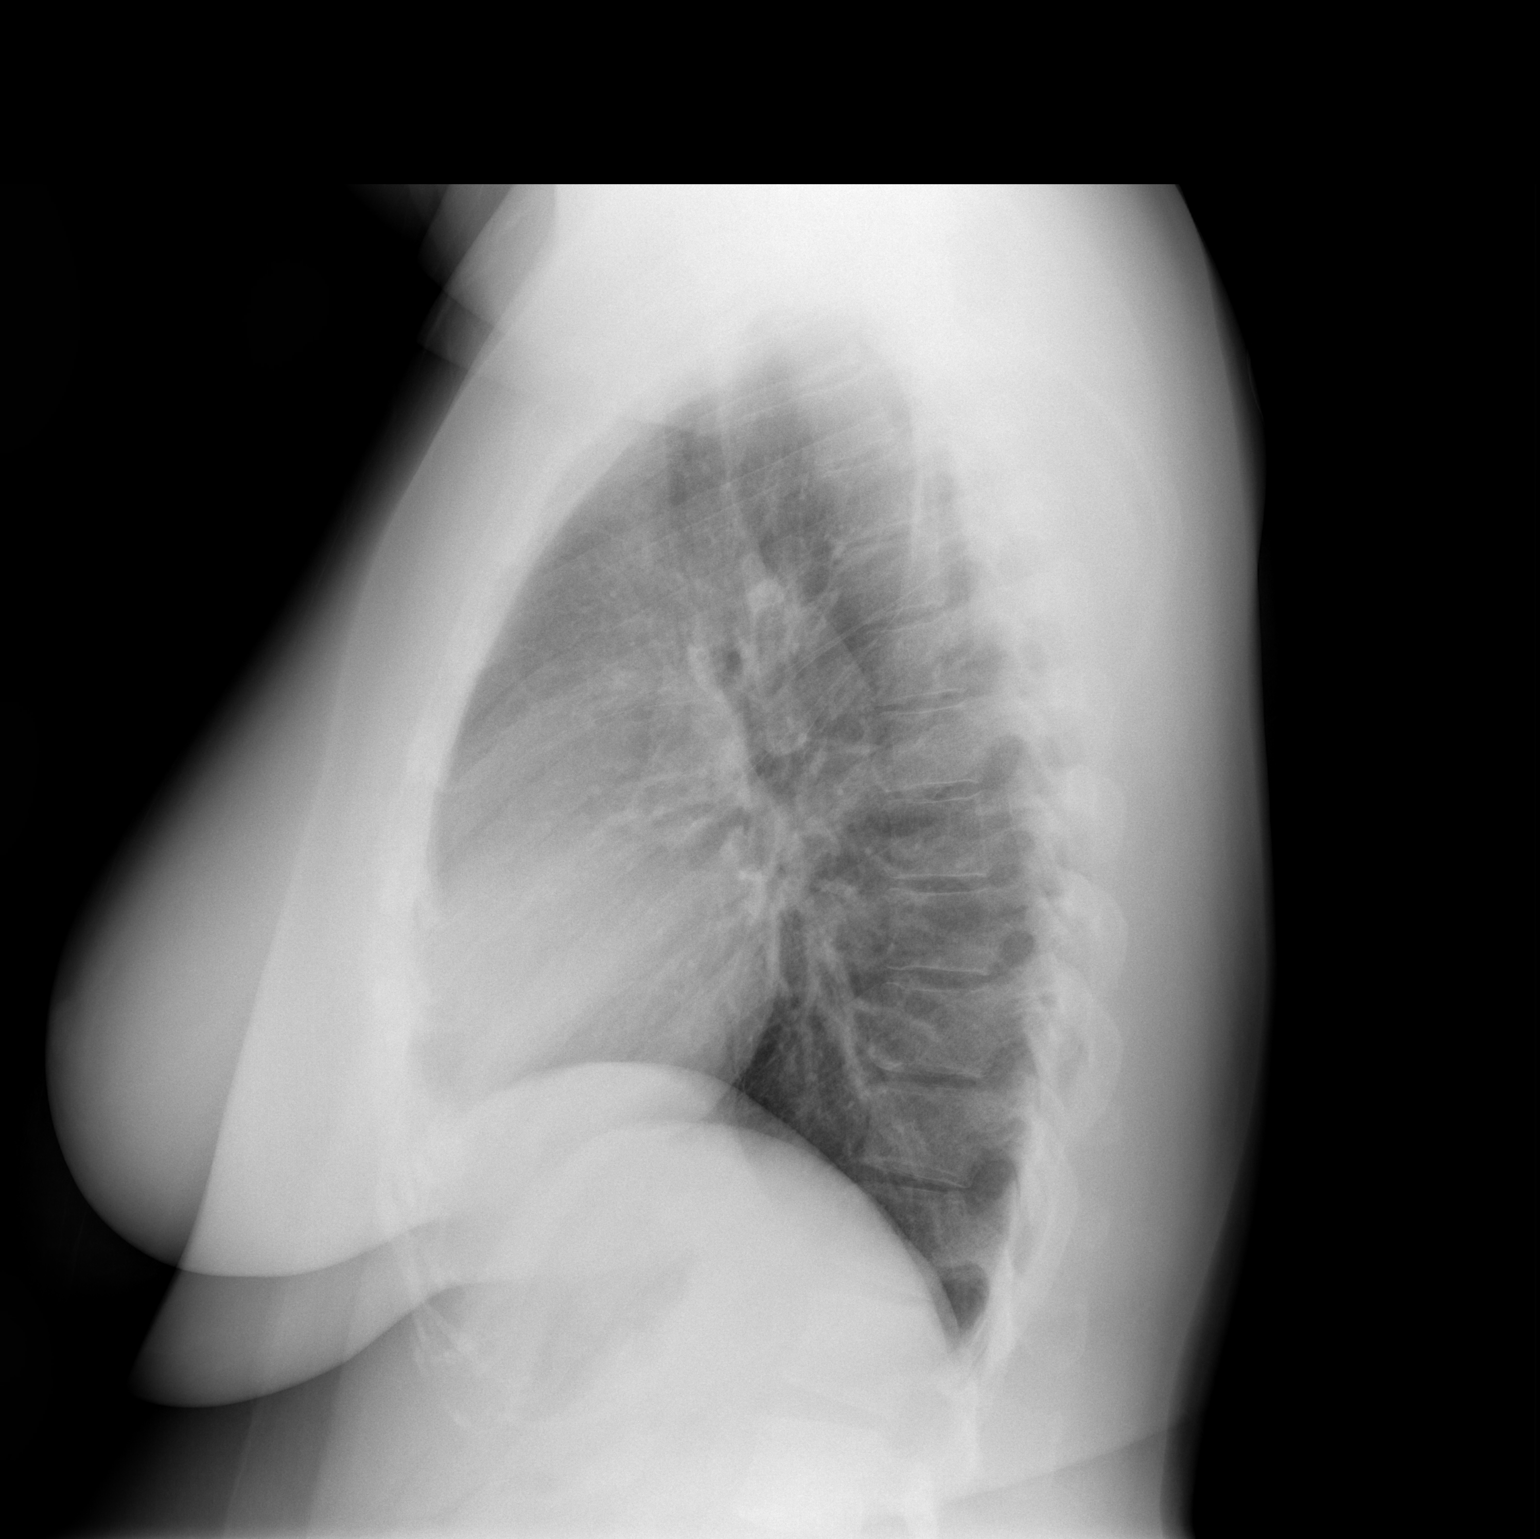

[2 of 2 positions shown; findings below may reference images not displayed]

FINDINGS: The heart size and mediastinal contours are within normal limits.
Both lungs are clear. The visualized skeletal structures are
unremarkable.
IMPRESSION: No active cardiopulmonary disease.

## 2020-04-09 ENCOUNTER — Ambulatory Visit: Payer: BLUE CROSS/BLUE SHIELD | Attending: Advanced Practice Midwife

## 2020-04-10 ENCOUNTER — Ambulatory Visit: Attending: Obstetrics & Gynecology

## 2020-04-10 ENCOUNTER — Ambulatory Visit: Admit: 2020-04-10 | Discharge: 2020-04-10 | Payer: BLUE CROSS/BLUE SHIELD | Attending: Obstetrics & Gynecology

## 2020-04-10 DIAGNOSIS — Z01419 Encounter for gynecological examination (general) (routine) without abnormal findings: Secondary | ICD-10-CM

## 2020-04-10 NOTE — Progress Notes (Signed)
Annual exam ages 25-64 post hysterectomy    Nicole Mcconnell is a G1 P10,  54 y.o. female BLACK/[place] No LMP recorded. Patient has had a hysterectomy. Hysterectomy d/t fibroids.      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 Sees a psychiatrist every 6 months, trying to find some group therapy.      She presents for her annual checkup. She is having yellow discharge.  Used monistat 3 day with no relief; finished the course about a week ago.  Discharge has been present for past 2 weeks  Recent negative colonoscopy  Hormonal status:  She reports hot flashes, vaginal dryness, mood changes.    She is not having vasomotor symptoms.  The patient is not using ERT.    Sexual history:    She  reports previously being sexually active and has had partner(s) who are female.    Medical conditions:    Since her last annual GYN exam about one year ago, she has not the following changes in her health history: none.   Surgical history confirmed with patient.  has a past surgical history that includes hx gyn and hx hysterectomy (2016).    Pap and Mammogram History:    No recent paps.      The patient has not had a recent mammogram, but when she does go, she has to get the 3d views - pt states benign mass in R breast.      Breast Cancer History/Substance Abuse: negative    Osteoporosis History:    Family history does not include a first or second degree relative with osteopenia or osteoporosis.      Past Medical History:   Diagnosis Date   ??? Asthma      Past Surgical History:   Procedure Laterality Date   ??? HX GYN     ??? HX HYSTERECTOMY  2016       Current Outpatient Medications   Medication Sig Dispense Refill   ??? trazodone HCl (TRAZODONE PO) Take  by mouth.     ??? multivitamin (VITAMIN DAILY PO) Take  by mouth.     ??? omega-3s/dha/epa/fish oil/D3 (VITAMIN-D + OMEGA-3 PO) Take  by mouth.     ??? albuterol (PROVENTIL HFA, VENTOLIN HFA, PROAIR HFA) 90 mcg/actuation inhaler Take  by inhalation.       Allergies: Patient has no known  allergies.     Tobacco History:  reports that she has never smoked. She has never used smokeless tobacco.  Alcohol Abuse:  reports current alcohol use.  Drug Abuse:  reports no history of drug use.    Family Medical/Cancer History: No family history on file.     Review of Systems - History obtained from the patient  Constitutional: negative for weight loss, fever, night sweats  HEENT: negative for hearing loss, earache, congestion, snoring, sorethroat  CV: negative for chest pain, palpitations, edema  Resp: negative for cough, shortness of breath, wheezing  GI: negative for change in bowel habits, abdominal pain, black or bloody stools  GU: negative for frequency, dysuria, hematuria, vaginal discharge  MSK: negative for back pain, joint pain, muscle pain  Breast: negative for breast lumps, nipple discharge, galactorrhea  Skin :negative for itching, rash, hives  Neuro: negative for dizziness, headache, confusion, weakness  Psych: negative for anxiety, depression, change in mood  Heme/lymph: negative for bleeding, bruising, pallor    Physical Exam    Visit Vitals  BP (!) 150/90   Ht '5\' 5"'  (1.651 m)  Wt 226 lb (102.5 kg)   BMI 37.61 kg/m??     Constitutional  ?? Appearance: well-nourished, well developed, alert, in no acute distress    HENT  ?? Head and Face: appears normal      Chest  ?? Respiratory Effort: breathing unlabored    Breasts  ?? Inspection of Breasts: breasts symmetrical, no skin changes, no discharge present, nipple appearance normal, no skin retraction present  ?? Palpation of Breasts and Axillae: no masses present on palpation, no breast tenderness  ?? Axillary Lymph Nodes: no lymphadenopathy present    Gastrointestinal  ?? Abdominal Examination: abdomen non-tender to palpation, normal bowel sounds, no masses present  ?? Liver and spleen: no hepatomegaly present, spleen not palpable  ?? Hernias: no hernias identified    Genitourinary  ?? External Genitalia: normal appearance for age, no discharge present, no  tenderness present, no inflammatory lesions present, no masses present,  atrophy present  ?? Vagina: normal vaginal vault without central or paravaginal defects, grey frothy discharge present, no inflammatory lesions present, no masses present  ?? Bladder: non-tender to palpation  ?? Urethra: appears normal  ?? Cervix: absent  ?? Uterus: absent  ?? Adnexa: no adnexal tenderness present, no adnexal masses present  ?? Perineum: perineum within normal limits, no evidence of trauma, no rashes or skin lesions present  ?? Anus: anus within normal limits, no hemorrhoids present  ?? Inguinal Lymph Nodes: no lymphadenopathy present    Skin  ?? General Inspection: no rash, no lesions identified    Neurologic/Psychiatric  ?? Mental Status:  ?? Orientation: grossly oriented to person, place and time  ?? Mood and Affect: mood normal, affect appropriate    Assessment:  Routine gynecologic examination  Vaginitis; STD screen  Her current medical status is satisfactory with no evidence of significant gynecologic issues.    Plan:  Pap not today  Nuswab  Patient offered and completed [facility] genetic screening questionnaire and no changes in personal and family pertinent medical history.  Patient declines presence of chaperone during today's visit.   Counseled re: diet, exercise, healthy lifestyle  Return for yearly wellness visits  Rec annual mammogram

## 2020-04-10 NOTE — Progress Notes (Signed)
Prescription for flagyl sent to patient's pharmacy

## 2020-04-10 NOTE — Progress Notes (Signed)
Please notify pt of results; BV and trich; script for flagyl 500mg  bid x7d needs to be sent to pharmacy; thanks

## 2020-04-12 MED ORDER — FLUCONAZOLE 150 MG TAB
150 mg | ORAL_TABLET | Freq: Every day | ORAL | 0 refills | Status: AC
Start: 2020-04-12 — End: 2020-04-13

## 2020-04-12 NOTE — Telephone Encounter (Signed)
Left voice message notifying pt of RX being sent.

## 2020-04-12 NOTE — Telephone Encounter (Signed)
Patient seen on 04/10/20 by Dr. Audrie Lia.  She states at the time of her appt a prescription for a yeast infection was supposed to be of been called in at that time.  As of right now I do not see anything that has been sent.    Please advise what medication to send.

## 2020-04-12 NOTE — Telephone Encounter (Signed)
Script diflucan sent

## 2020-04-13 LAB — NUSWAB VAGINITIS PLUS (VG+)
Atopobium Vaginae: HIGH Score — AB
Candida albicans, NAA: NEGATIVE
Candida glabrata by PCR: NEGATIVE
Chlamydia trachomatis, NAA: NEGATIVE
MEGASHAERA: HIGH Score — AB
Neisseria Gonorrhoeae, NAA: NEGATIVE
Trichomonas Vaginalis by NAA: POSITIVE — AB

## 2020-04-13 LAB — NUSWAB VAGINITIS PLUS
Atopobium vaginae: HIGH Score — AB
C. albicans, NAA: NEGATIVE
C. glabrata, NAA: NEGATIVE
C. trachomatis, NAA: NEGATIVE
Megasphaera 1: HIGH Score — AB
N. gonorrhoeae, NAA: NEGATIVE
T. vaginalis, NAA: POSITIVE — AB

## 2020-04-13 LAB — SPECIMEN STATUS REPORT

## 2020-04-16 MED ORDER — METRONIDAZOLE 500 MG TAB
500 mg | ORAL_TABLET | Freq: Two times a day (BID) | ORAL | 0 refills | Status: AC
Start: 2020-04-16 — End: 2020-04-23

## 2020-10-22 ENCOUNTER — Encounter: Payer: Self-pay | Admitting: Allergy & Immunology

## 2020-10-22 ENCOUNTER — Other Ambulatory Visit: Payer: Self-pay

## 2020-10-22 ENCOUNTER — Ambulatory Visit: Payer: Federal, State, Local not specified - PPO | Admitting: Allergy & Immunology

## 2020-10-22 VITALS — BP 142/80 | HR 80 | Temp 98.3°F | Resp 18 | Ht 65.0 in | Wt 224.6 lb

## 2020-10-22 DIAGNOSIS — J301 Allergic rhinitis due to pollen: Secondary | ICD-10-CM

## 2020-10-22 DIAGNOSIS — J4540 Moderate persistent asthma, uncomplicated: Secondary | ICD-10-CM | POA: Diagnosis not present

## 2020-10-22 MED ORDER — ALBUTEROL SULFATE HFA 108 (90 BASE) MCG/ACT IN AERS: INHALATION_SPRAY | RESPIRATORY_TRACT | 1 refills | Status: DC

## 2020-10-22 MED ORDER — BUDESONIDE-FORMOTEROL FUMARATE 160-4.5 MCG/ACT IN AERO: INHALATION_SPRAY | RESPIRATORY_TRACT | 1 refills | Status: DC

## 2020-10-22 MED ORDER — SPACER/AERO-HOLDING CHAMBERS DEVI: 1.0000 | Freq: Once | 0 refills | Status: AC

## 2020-10-22 MED ORDER — CETIRIZINE HCL 10 MG PO TABS: 10.0000 mg | ORAL_TABLET | Freq: Every day | ORAL | 5 refills | Status: DC

## 2020-10-22 MED ORDER — FLUTICASONE PROPIONATE 50 MCG/ACT NA SUSP: 2.0000 | Freq: Every day | NASAL | 5 refills | Status: DC

## 2020-10-22 NOTE — Progress Notes (Signed)
Spoke to patient and she states she just moved here and does not currently have a representative at the moment. Informed patient that when [facility] gets established [facility] a [facility] to let us know so I can send over notes. Patient verbalized understanding.

## 2020-10-22 NOTE — Patient Instructions (Addendum)
1. Moderate persistent asthma, uncomplicated - Lung testing looks fairly good, but it did not improve even more with albuterol. - This points towards a diagnosis of asthma. - Testing today is revealing for pollen sensitization, but since you are having issues during the entirety of the year this does not explain all of your symptoms. - Because you are having nightly coughing, we are going to start a daily controller medication. - Spacer use reviewed. - Daily controller medication(s): Symbicort 160/4.24mcg two puffs twice daily with spacer - Prior to physical activity: albuterol 2 puffs 10-15 minutes before physical activity. - Rescue medications: albuterol 4 puffs every 4-6 hours as needed - Asthma control goals:  * Full participation in all desired activities (may need albuterol before activity) * Albuterol use two time or less a week on average (not counting use with activity) * Cough interfering with sleep two time or less a month * Oral steroids no more than once a year * No hospitalizations  2. Seasonal allergic rhinitis due to pollen - Testing today showed: grasses, ragweed, weeds and trees - Copy of test results provided.  - Avoidance measures provided. - Continue with: Zyrtec (cetirizine) 10mg  tablet once daily and your Coricidin at night (as long as your blood pressure is under good control) - Start taking: Flonase (fluticasone) two sprays per nostril daily (this will help decrease mucus production and postnasal drip) - You can use an extra dose of the antihistamine, if needed, for breakthrough symptoms.  - Consider nasal saline rinses 1-2 times daily to remove allergens from the nasal cavities as well as help with mucous clearance (this is especially helpful to do before the nasal sprays are given) - Consider allergy shots as a means of long-term control. - Allergy shots "re-train" and "reset" the immune system to ignore environmental allergens and decrease the resulting immune  response to those allergens (sneezing, itchy watery eyes, runny nose, nasal congestion, etc).    - Allergy shots improve symptoms in 75-85% of patients.  - We can discuss more at the next appointment if the medications are not working for you.  3. Return in about 2 months (around 12/22/2020).    Please inform 12/24/2020 of any Emergency Department visits, hospitalizations, or changes in symptoms. Call us before going to the ED for breathing or allergy symptoms since we might be able to fit you in for a sick visit. Feel free to contact us anytime with any questions, problems, or concerns.  It was a pleasure to meet you today!  Websites that have reliable patient information: 1. American Academy of Asthma, Allergy, and Immunology: www.aaaai.org 2. Food Allergy Research and Education (FARE): foodallergy.org 3. Mothers of Asthmatics: http://www.asthmacommunitynetwork.org 4. American College of Allergy, Asthma, and Immunology: www.acaai.org   COVID-19 Vaccine Information can be found at: [place] For questions related to vaccine distribution or appointments, please email [email or web address] or call [phone].   We realize that you might be concerned about having an allergic reaction to the COVID19 vaccines. To help with that concern, WE ARE OFFERING THE COVID19 VACCINES IN OUR OFFICE! Ask the front desk for dates!     "Like" [phone] on Facebook and Instagram for our latest updates!      A healthy democracy works best when [place] participate! Make sure you are registered to vote! If you have moved or changed any of your contact information, you will need to get this updated before voting!  In some cases, you MAY be able to register to vote online:  [email or web address]    Reducing Pollen Exposure  The American Academy of Allergy, Asthma and Immunology suggests the following steps to reduce your  exposure to pollen during allergy seasons.    1. Do not hang sheets or clothing out to dry; pollen may collect on these items. 2. Do not mow lawns or spend time around freshly cut grass; mowing stirs up pollen. 3. Keep windows closed at night.  Keep car windows closed while driving. 4. Minimize morning activities outdoors, a time when pollen counts are usually at their highest. 5. Stay indoors as much as possible when pollen counts or humidity is high and on windy days when pollen tends to remain in the air longer. 6. Use air conditioning when possible.  Many air conditioners have filters that trap the pollen spores. 7. Use a HEPA room air filter to remove pollen form the indoor air you breathe.   1. Control-Buffer 50% Glycerol Negative   2. Control-Histamine 1 mg/ml 2+   3. Albumin saline Negative   4. Bahia Negative   5. [place] Negative   6. Johnson 2+   7. Kentucky Blue 3+   8. Meadow Fescue Negative   9. Perennial Rye 3+   10. Sweet Vernal 2+   11. Timothy Negative   12. Cocklebur 3+   13. Burweed Marshelder 3+   14. Ragweed, short 2+   15. Ragweed, Giant 2+   16. Plantain,  English Negative   17. Lamb's Quarters Negative   18. Sheep Sorrell Negative   19. Rough Pigweed Negative   20. Marsh Elder, Rough 3+   21. Mugwort, Common 2+   22. Ash mix 4+   23. Birch mix 4+   24. Beech American 4+   25. Box, Elder 2+   26. Cedar, red 3+   27. Cottonwood, [place] Negative   28. Elm mix Negative   29. Hickory 2+   30. Maple mix 2+   31. Oak, [place] mix 3+   32. Pecan Pollen 2+   33. Pine mix Negative   34. Sycamore Eastern Negative   35. Walnut, Black Pollen Negative   36. Alternaria alternata Negative   37. Cladosporium Herbarum Negative   38. Aspergillus mix Negative   39. Penicillium mix Negative   40. Bipolaris sorokiniana (Helminthosporium) Negative   41. Drechslera spicifera (Curvularia) Negative   42. Mucor plumbeus Negative   43. Fusarium moniliforme Negative    44. Aureobasidium pullulans (pullulara) Negative   45. Rhizopus oryzae Negative   46. Botrytis cinera Negative   47. Epicoccum nigrum Negative   48. Phoma betae Negative   49. Candida Albicans Negative   50. Trichophyton mentagrophytes Negative   51. Mite, D Farinae  5,000 AU/ml Negative   52. Mite, D Pteronyssinus  5,000 AU/ml Negative   53. Cat Hair 10,000 BAU/ml Negative   54.  Dog Epithelia Negative   55. Mixed Feathers Negative   56. Horse Epithelia Negative   57. Cockroach, German Negative   58. Mouse Negative   59. Tobacco Leaf Negative

## 2020-10-22 NOTE — Progress Notes (Signed)
NEW PATIENT  Date of Service/Encounter:  10/22/20  Consult requested by: Patient, Tiffany Benson (Inactive)   Assessment:   Moderate persistent asthma, uncomplicated  Seasonal allergic rhinitis due to pollen (grasses, ragweed, weeds and trees)  History of burn pit exposure for 13 months in 2007-2008  Plan/Recommendations:   1. Moderate persistent asthma, uncomplicated - Lung testing looks fairly good, but it did not improve even more with albuterol. - This points towards a diagnosis of asthma. - Testing today is revealing for pollen sensitization, but since you are having issues during the entirety of the year this does not explain all of your symptoms. - Because you are having nightly coughing, we are going to start a daily controller medication. - Spacer use reviewed. - Daily controller medication(s): Symbicort 160/4.54mcg two puffs twice daily with spacer - Prior to physical activity: albuterol 2 puffs 10-15 minutes before physical activity. - Rescue medications: albuterol 4 puffs every 4-6 hours as needed - Asthma control goals:  * Full participation in all desired activities (may need albuterol before activity) * Albuterol use two time or less a week on average (not counting use with activity) * Cough interfering with sleep two time or less a month * Oral steroids Tiffany more than once a year * Tiffany hospitalizations  2. Seasonal allergic rhinitis due to pollen - Testing today showed: grasses, ragweed, weeds and trees - Copy of test results provided.  - Avoidance measures provided. - Continue with: Zyrtec (cetirizine) 10mg  tablet once daily and your Coricidin at night (as long as your blood pressure is under good control) - Start taking: Flonase (fluticasone) two sprays Benson nostril daily (this will help decrease mucus production and postnasal drip) - You can use an extra dose of the antihistamine, if needed, for breakthrough symptoms.  - Consider nasal saline rinses 1-2 times  daily to remove allergens from the nasal cavities as well as help with mucous clearance (this is especially helpful to do before the nasal sprays are given) - Consider allergy shots as a means of long-term control. - Allergy shots "re-train" and "reset" the immune system to ignore environmental allergens and decrease the resulting immune response to those allergens (sneezing, itchy watery eyes, runny nose, nasal congestion, etc).    - Allergy shots improve symptoms in 75-85% of patients.  - We can discuss more at the next appointment if the medications are not working for you.  3. Return in about 2 months (around 12/22/2020).    This note in its entirety was forwarded to the Provider who requested this consultation.  Subjective:   Tiffany Benson is a 55 y.o. female presenting today for evaluation of  Chief Complaint  Patient presents with  . Asthma    Shortness of breath     Tiffany Benson has a history of the following: Patient Active Problem List   Diagnosis Date Noted  . Seasonal allergic rhinitis due to pollen 10/22/2020  . Moderate persistent asthma, uncomplicated 10/22/2020    History obtained from: chart review and patient.  10/24/2020 was referred by Patient, Tiffany Benson (Inactive).     Tiffany Benson is a 55 y.o. female presenting for an evaluation of asthma and allergies.   Asthma/Respiratory Symptom History: She reports that she was [place] 2007 and then came back with a diagnosis of asthma. She was exposed to burn pits and she is trying toi update her documentation to confirm this. She has been on albuterol and has not been on this  in a while. One year ago she started having more usage of her albuterol. She started using Primatene Mist. This did provide relief of her symptoms. She uses in the morning and uses two puffs and is done. She is going through albuterol and is using it as needed. She has not needed prednisone in over one year for her breathing. She does have  nightly coughing. She takes the Coricidin at night to put her to sleep. She never wakes up refreshed. She even uses Ambien.   After getting back from [place], she was only ever given an inhaler. She does issues at work sometimes with physical activity and dust exposures. She was exposed to burning pits on a number of occasions during her 13 month stay there. She tells me that she was exposed to burning pits on a daily bassi during her 13 month stint there.   Allergic Rhinitis Symptom History: She has a history of itching watery eyes and runny nose. She has this mostly in the spring time but it does continue in the summer. She has Tiffany symptoms during the winter. She has been allergy tested in the past. This was Tiffany Benson in 2016. She only uses cetirizine in the morning and the Coricidin. She also does a lot of throat clearing.      She has a history of PTSD. She also has a history of back pain. She is service connected partially for this.   Her parents moved to [place]. She was born [place]. She was in the [employer] for 10 years. She is not married and has Tiffany children at all.   Otherwise, there is Tiffany history of other atopic diseases, including food allergies, drug allergies, stinging insect allergies, ecz, urticaria or contact dermatitis. There is Tiffany significant infectious history. Vaccinations are up to date.    Past Medical History: Patient Active Problem List   Diagnosis Date Noted  . Seasonal allergic rhinitis due to pollen 10/22/2020  . Moderate persistent asthma, uncomplicated 10/22/2020    Medication List:  Allergies as of 10/22/2020   Tiffany Known Allergies     Medication List       Accurate as of Oct 22, 2020  9:38 AM. If you have any questions, ask your nurse or doctor.        acetaminophen 500 MG tablet Commonly known as: TYLENOL Take 500 mg by mouth every 6 (six) hours as needed (cramps).   albuterol 108 (90 Base) MCG/ACT inhaler Commonly known as: VENTOLIN  HFA Inhale 2 puffs into the lungs every 6 (six) hours as needed for wheezing.   buPROPion 200 MG 12 hr tablet Commonly known as: WELLBUTRIN SR Take 200 mg by mouth 2 (two) times daily.   clonazePAM 2 MG tablet Commonly known as: KLONOPIN   hydrOXYzine 25 MG tablet Commonly known as: ATARAX/VISTARIL hydroxyzine HCl 25 mg tablet   Integra 62.5-62.5-40-3 MG Caps Take 1 capsule by mouth 2 (two) times daily.   methocarbamol 500 MG tablet Commonly known as: ROBAXIN Take 2 tablets (1,000 mg total) by mouth 4 (four) times daily.   naproxen 500 MG tablet Commonly known as: NAPROSYN Take 1 tablet (500 mg total) by mouth 2 (two) times daily.   pantoprazole 20 MG tablet Commonly known as: PROTONIX Take by mouth.   Spacer/Aero-Holding Rudean Curthambers Devi 1 each by Does not apply route once for 1 dose. Started by: Alfonse SpruceJoel Louis Jerrol Helmers, MD   zolpidem 10 MG tablet Commonly known as: AMBIEN Take 10  mg by mouth at bedtime as needed for sleep.       Birth History: non-contributory  Developmental History: non-contributory  Past Surgical History: Past Surgical History:  Procedure Laterality Date  . ABDOMINAL HYSTERECTOMY       Family History: Family History  Problem Relation Age of Onset  . Hypertension Other      Social History: Mishayla lives at home in an apartment that is 54 years old.  There is carpeting throughout the home.  She has electric heating and heat pump for cooling.  There are Tiffany animals inside or outside of the home.  There are Tiffany dust mite covers on the bedding.  There is Tiffany tobacco exposure.  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  She is exposed to fumes, chemicals, or dust.  She does use a HEPA filter in the home. She works for the [employer].    Review of Systems  Constitutional: Negative.  Negative for chills, fever, malaise/fatigue and weight loss.  HENT: Positive for congestion. Negative for ear discharge, ear pain and sore throat.         Positive for postnasal drip.  Eyes: Negative for pain, discharge and redness.  Respiratory: Positive for cough, shortness of breath and wheezing. Negative for sputum production.   Cardiovascular: Negative.  Negative for chest pain and palpitations.  Gastrointestinal: Negative for abdominal pain, constipation, diarrhea, heartburn, nausea and vomiting.  Skin: Negative.  Negative for itching and rash.  Neurological: Negative for dizziness and headaches.  Endo/Heme/Allergies: Negative for environmental allergies. Does not bruise/bleed easily.       Objective:   Blood pressure (!) 142/80, pulse 80, temperature 98.3 F (36.8 C), resp. rate 18, height 5\' 5"  (1.651 m), weight 224 lb 9.6 oz (101.9 kg), SpO2 97 %. Body mass index is 37.38 kg/m.   Physical Exam:   Physical Exam Constitutional:      Appearance: She is well-developed.  HENT:     Head: Normocephalic and atraumatic.     Right Ear: Tympanic membrane, ear canal and external ear normal. Tiffany drainage, swelling or tenderness. Tympanic membrane is not injected, scarred, erythematous, retracted or bulging.     Left Ear: Tympanic membrane, ear canal and external ear normal. Tiffany drainage, swelling or tenderness. Tympanic membrane is not injected, scarred, erythematous, retracted or bulging.     Ears:     Comments: Congestion bilaterally.    Nose: Mucosal edema and rhinorrhea present. Tiffany nasal deformity or septal deviation.     Right Turbinates: Enlarged and swollen.     Left Turbinates: Enlarged and swollen.     Right Sinus: Tiffany maxillary sinus tenderness or frontal sinus tenderness.     Left Sinus: Tiffany maxillary sinus tenderness or frontal sinus tenderness.     Comments: Tiffany cobblestoning present.     Mouth/Throat:     Mouth: Mucous membranes are not pale and not dry.     Pharynx: Uvula midline.  Eyes:     General:        Right eye: Tiffany discharge.        Left eye: Tiffany discharge.     Conjunctiva/sclera: Conjunctivae normal.     Right  eye: Right conjunctiva is not injected. Tiffany chemosis.    Left eye: Left conjunctiva is not injected. Tiffany chemosis.    Pupils: Pupils are equal, round, and reactive to light.  Cardiovascular:     Rate and Rhythm: Normal rate and regular rhythm.     Heart  sounds: Normal heart sounds.  Pulmonary:     Effort: Pulmonary effort is normal. Tiffany tachypnea, accessory muscle usage or respiratory distress.     Breath sounds: Normal breath sounds. Tiffany wheezing, rhonchi or rales.     Comments: Moving air well in all lung fields. Tiffany increased work of breathing noted. There may be some decreased work of breathing noted at the bases.  Chest:     Chest wall: Tiffany tenderness.  Abdominal:     Tenderness: There is Tiffany abdominal tenderness. There is Tiffany guarding or rebound.  Lymphadenopathy:     Head:     Right side of head: Tiffany submandibular, tonsillar or occipital adenopathy.     Left side of head: Tiffany submandibular, tonsillar or occipital adenopathy.     Cervical: Tiffany cervical adenopathy.  Skin:    General: Skin is warm.     Capillary Refill: Capillary refill takes less than 2 seconds.     Coloration: Skin is not pale.     Findings: Tiffany abrasion, erythema, petechiae or rash. Rash is not papular, urticarial or vesicular.     Comments: Tiffany eczematous or urticarial lesions noted.   Neurological:     Mental Status: She is alert.  Psychiatric:        Behavior: Behavior is cooperative.      Diagnostic studies:    Spirometry: results normal (FEV1: 2.36/96%, FVC: 2.87/93%, FEV1/FVC: 82%).    Spirometry consistent with normal pattern. Albuterol four puffs via MDI treatment given in clinic with improvement in FEV1 and FVC, but not significant Benson ATS criteria.  Allergy Studies:     Airborne Adult Perc - 10/22/20 0844    Time Antigen Placed 0844    Allergen Manufacturer Waynette Buttery    Location Back    Number of Test 59    Panel 1 Select    1. Control-Buffer 50% Glycerol Negative    2. Control-Histamine 1 mg/ml 2+     3. Albumin saline Negative    4. Bahia Negative    5. French Southern Territories Negative    6. Johnson 2+    7. [place] Blue 3+    8. Meadow Fescue Negative    9. Perennial Rye 3+    10. Sweet Vernal 2+    11. Timothy Negative    12. Cocklebur 3+    13. Burweed Marshelder 3+    14. Ragweed, short 2+    15. Ragweed, Giant 2+    16. Plantain,  English Negative    17. Lamb's Quarters Negative    18. Sheep Sorrell Negative    19. Rough Pigweed Negative    20. Marsh Elder, Rough 3+    21. Mugwort, Common 2+    22. Ash mix 4+    23. Birch mix 4+    24. Beech American 4+    25. Box, Elder 2+    26. Cedar, red 3+    27. Cottonwood, Guinea-Bissau Negative    28. Elm mix Negative    29. Hickory 2+    30. Maple mix 2+    31. Oak, Guinea-[place]issau mix 3+    32. Pecan Pollen 2+    33. Pine mix Negative    34. Sycamore Eastern Negative    35. Walnut, Black Pollen Negative    36. Alternaria alternata Negative    37. Cladosporium Herbarum Negative    38. Aspergillus mix Negative    39. Penicillium mix Negative    40. Bipolaris sorokiniana (Helminthosporium) Negative  41. Drechslera spicifera (Curvularia) Negative    42. Mucor plumbeus Negative    43. Fusarium moniliforme Negative    44. Aureobasidium pullulans (pullulara) Negative    45. Rhizopus oryzae Negative    46. Botrytis cinera Negative    47. Epicoccum nigrum Negative    48. Phoma betae Negative    49. Candida Albicans Negative    50. Trichophyton mentagrophytes Negative    51. Mite, D Farinae  5,000 AU/ml Negative    52. Mite, D Pteronyssinus  5,000 AU/ml Negative    53. Cat Hair 10,000 BAU/ml Negative    54.  Dog Epithelia Negative    55. Mixed Feathers Negative    56. Horse Epithelia Negative    57. Cockroach, German Negative    58. Mouse Negative    59. Tobacco Leaf Negative           Allergy testing results were read and interpreted by myself, documented by clinical staff.         Malachi Bonds, MD [facility]

## 2020-12-22 NOTE — Patient Instructions (Addendum)
1. Moderate persistent asthma, uncomplicated - Daily controller medication(s): Symbicort 160/4.48mcg two puffs twice daily with spacer - Prior to physical activity: albuterol 2 puffs 10-15 minutes before physical activity. - Rescue medications: albuterol 4 puffs every 4-6 hours as needed - Asthma control goals:  * Full participation in all desired activities (may need albuterol before activity) * Albuterol use two time or less a week on average (not counting use with activity) * Cough interfering with sleep two time or less a month * Oral steroids no more than once a year * No hospitalizations  2. Seasonal allergic rhinitis due to pollen (grasses, ragweed, weeds, and trees) - Continue with: Zyrtec (cetirizine) 10mg  tablet once daily and Flonase (fluticasone) two sprays per nostril daily  - You can use an extra dose of the antihistamine, if needed, for breakthrough symptoms.  - Consider nasal saline rinses 1-2 times daily to remove allergens from the nasal cavities as well as help with mucous clearance (this is especially helpful to do before the nasal sprays are given) - Consider allergy shots as a means of long-term control. - Allergy shots "re-train" and "reset" the immune system to ignore environmental allergens and decrease the resulting immune response to those allergens (sneezing, itchy watery eyes, runny nose, nasal congestion, etc).    - Allergy shots improve symptoms in 75-85% of patients.  - We can discuss more at the next appointment if the medications are not working for you.  Please let [id number] know if this treatment plan is not working well for you. Schedule a follow up appointment in 4-5 months or sooner if needed

## 2020-12-23 ENCOUNTER — Encounter: Payer: Self-pay | Admitting: Family

## 2020-12-23 ENCOUNTER — Other Ambulatory Visit: Payer: Self-pay

## 2020-12-23 ENCOUNTER — Ambulatory Visit: Payer: Federal, State, Local not specified - PPO | Admitting: Family

## 2020-12-23 VITALS — BP 136/78 | HR 89 | Temp 97.6°F | Resp 18 | Ht 65.0 in | Wt 224.4 lb

## 2020-12-23 DIAGNOSIS — J301 Allergic rhinitis due to pollen: Secondary | ICD-10-CM | POA: Diagnosis not present

## 2020-12-23 DIAGNOSIS — J4540 Moderate persistent asthma, uncomplicated: Secondary | ICD-10-CM

## 2020-12-23 MED ORDER — FLUTICASONE PROPIONATE 50 MCG/ACT NA SUSP: 2.0000 | Freq: Every day | NASAL | 5 refills | Status: AC

## 2020-12-23 MED ORDER — ALBUTEROL SULFATE HFA 108 (90 BASE) MCG/ACT IN AERS: INHALATION_SPRAY | RESPIRATORY_TRACT | 1 refills | Status: AC

## 2020-12-23 MED ORDER — BUDESONIDE-FORMOTEROL FUMARATE 160-4.5 MCG/ACT IN AERO: INHALATION_SPRAY | RESPIRATORY_TRACT | 5 refills | Status: AC

## 2020-12-23 MED ORDER — CETIRIZINE HCL 10 MG PO TABS: 10.0000 mg | ORAL_TABLET | Freq: Every day | ORAL | 5 refills | Status: AC

## 2020-12-23 NOTE — Progress Notes (Signed)
[address] Debbora Benson [place] [address] Dept: [phone]  FOLLOW UP NOTE  Patient ID: Tiffany Benson, female    DOB: 06-14-1965  Age: 55 y.o. MRN: [id number] Date of Office Visit: 12/23/2020  Assessment  Chief Complaint: Asthma (ACT 23 )  HPI Tiffany Benson is a 55 year old female who presents today for follow-up of moderate persistent asthma, seasonal allergic rhinitis due to pollen, history of burn pit exposure for 13 months in 2007 through 2008.  He was last seen on Oct 22, 2020 by Dr. Dellis Anes.  Moderate persistent asthma is reported as doing better since starting Symbicort 160/4.5 mcg 2 puffs twice a day with spacer and albuterol as needed.  She reports occasional tightness in her chest at times and denies coughing, wheezing, shortness of breath, and nocturnal awakenings due to breathing problems.  Since her last office visit she has not required any systemic steroids or made any trips to the emergency room or urgent care due to breathing problems.  She has not had to use her albuterol inhaler since her last office visit.  Seasonal allergic rhinitis due to pollen is reported as controlled with Zyrtec 10 mg once a day and Flonase nasal spray as needed.  She reports that she is no longer taking Coricidin at night.  She denies any rhinorrhea, nasal congestion, and postnasal drip.  She has not had any sinus infections since we last saw her.   Drug Allergies:  No Known Allergies  Review of Systems: Review of Systems  Constitutional:  Negative for chills and fever.  HENT:         Denies rhinorrhea, nasal congestion, and postnasal drip  Eyes:        Denies itchy watery eyes  Respiratory:  Negative for cough, shortness of breath and wheezing.        Reports tightness in her chest at times.  Denies coughing, wheezing, shortness of breath, and nocturnal awakenings due to breathing problems  Cardiovascular:  Negative for chest pain and palpitations.  Gastrointestinal:         Denies heartburn or reflux symptoms  Genitourinary:  Negative for dysuria.  Skin:  Negative for itching and rash.  Neurological:  Negative for headaches.  Endo/Heme/Allergies:  Positive for environmental allergies.    Physical Exam: BP 136/78   Pulse 89   Temp 97.6 F (36.4 C)   Resp 18   Ht 5\' 5"  (1.651 m)   Wt 224 lb 6.4 oz (101.8 kg)   SpO2 100%   BMI 37.34 kg/m    Physical Exam Constitutional:      Appearance: Normal appearance.  HENT:     Head: Normocephalic and atraumatic.     Comments: Pharynx normal, eyes normal, ears normal, nose normal    Right Ear: Tympanic membrane, ear canal and external ear normal.     Left Ear: Tympanic membrane, ear canal and external ear normal.     Mouth/Throat:     Mouth: Mucous membranes are moist.     Pharynx: Oropharynx is clear.  Eyes:     Conjunctiva/sclera: Conjunctivae normal.  Cardiovascular:     Rate and Rhythm: Normal rate and regular rhythm.     Heart sounds: Normal heart sounds.  Pulmonary:     Effort: Pulmonary effort is normal.     Breath sounds: Normal breath sounds.     Comments: Lungs clear to auscultation Musculoskeletal:     Cervical back: Neck supple.  Skin:    General: Skin is warm.  Neurological:  Mental Status: She is alert and oriented to person, place, and time.  Psychiatric:        Mood and Affect: Mood normal.        Behavior: Behavior normal.        Thought Content: Thought content normal.        Judgment: Judgment normal.    Diagnostics: FVC 2.73 L (89%), FEV1 2.30 L(94%).  Predicted FVC 3.08 L, predicted FEV1 2.45 L.  Spirometry indicates normal ventilatory function  Assessment and Plan: 1. Moderate persistent asthma, uncomplicated   2. Seasonal allergic rhinitis due to pollen     No orders of the defined types were placed in this encounter.   Patient Instructions  1. Moderate persistent asthma, uncomplicated - Daily controller medication(s): Symbicort 160/4.50mcg two puffs twice daily  with spacer - Prior to physical activity: albuterol 2 puffs 10-15 minutes before physical activity. - Rescue medications: albuterol 4 puffs every 4-6 hours as needed - Asthma control goals:  * Full participation in all desired activities (may need albuterol before activity) * Albuterol use two time or less a week on average (not counting use with activity) * Cough interfering with sleep two time or less a month * Oral steroids no more than once a year * No hospitalizations  2. Seasonal allergic rhinitis due to pollen (grasses, ragweed, weeds, and trees) - Continue with: Zyrtec (cetirizine) 10mg  tablet once daily and Flonase (fluticasone) two sprays per nostril daily  - You can use an extra dose of the antihistamine, if needed, for breakthrough symptoms.  - Consider nasal saline rinses 1-2 times daily to remove allergens from the nasal cavities as well as help with mucous clearance (this is especially helpful to do before the nasal sprays are given) - Consider allergy shots as a means of long-term control. - Allergy shots "re-train" and "reset" the immune system to ignore environmental allergens and decrease the resulting immune response to those allergens (sneezing, itchy watery eyes, runny nose, nasal congestion, etc).    - Allergy shots improve symptoms in 75-85% of patients.  - We can discuss more at the next appointment if the medications are not working for you.  Please let [id number] know if this treatment plan is not working well for you. Schedule a follow up appointment in 4-5 months or sooner if needed Return in about 4 months (around 04/25/2021), or if symptoms worsen or fail to improve.    Thank you for the opportunity to care for this patient.  Please do not hesitate to contact me with questions.  04/27/2021, FNP [facility]

## 2021-05-27 NOTE — Patient Instructions (Incomplete)
1. Moderate persistent asthma, uncomplicated - Daily controller medication(s): Symbicort 160/4.24mcg two puffs twice daily with spacer - Prior to physical activity: albuterol 2 puffs 10-15 minutes before physical activity. - Rescue medications: albuterol 4 puffs every 4-6 hours as needed - Asthma control goals:  * Full participation in all desired activities (may need albuterol before activity) * Albuterol use two time or less a week on average (not counting use with activity) * Cough interfering with sleep two time or less a month * Oral steroids no more than once a year * No hospitalizations  2. Seasonal allergic rhinitis due to pollen (grasses, ragweed, weeds, and trees) - Continue with: Zyrtec (cetirizine) 10mg  tablet once daily and Flonase (fluticasone) two sprays per nostril daily  - You can use an extra dose of the antihistamine, if needed, for breakthrough symptoms.  - Consider nasal saline rinses 1-2 times daily to remove allergens from the nasal cavities as well as help with mucous clearance (this is especially helpful to do before the nasal sprays are given) - Consider allergy shots as a means of long-term control. - Allergy shots "re-train" and "reset" the immune system to ignore environmental allergens and decrease the resulting immune response to those allergens (sneezing, itchy watery eyes, runny nose, nasal congestion, etc).    - Allergy shots improve symptoms in 75-85% of patients.  - We can discuss more at the next appointment if the medications are not working for you.  Please let [email or web address] know if this treatment plan is not working well for you. Schedule a follow up appointment in  months or sooner if needed

## 2021-05-28 ENCOUNTER — Ambulatory Visit: Payer: Federal, State, Local not specified - PPO | Admitting: Family

## 2021-05-28 DIAGNOSIS — J309 Allergic rhinitis, unspecified: Secondary | ICD-10-CM

## 2024-01-16 ENCOUNTER — Emergency Department (HOSPITAL_BASED_OUTPATIENT_CLINIC_OR_DEPARTMENT_OTHER)
Admission: EM | Admit: 2024-01-16 | Discharge: 2024-01-16 | Disposition: A | Discharge status: Home / Self Care | Attending: Emergency Medicine | Admitting: Emergency Medicine

## 2024-01-16 ENCOUNTER — Encounter (HOSPITAL_BASED_OUTPATIENT_CLINIC_OR_DEPARTMENT_OTHER): Payer: Self-pay

## 2024-01-16 DIAGNOSIS — Z7951 Long term (current) use of inhaled steroids: Secondary | ICD-10-CM | POA: Insufficient documentation

## 2024-01-16 DIAGNOSIS — J45909 Unspecified asthma, uncomplicated: Secondary | ICD-10-CM | POA: Insufficient documentation

## 2024-01-16 DIAGNOSIS — X58XXXA Exposure to other specified factors, initial encounter: Secondary | ICD-10-CM | POA: Insufficient documentation

## 2024-01-16 DIAGNOSIS — S7012XA Contusion of left thigh, initial encounter: Secondary | ICD-10-CM | POA: Insufficient documentation

## 2024-01-16 DIAGNOSIS — S79922A Unspecified injury of left thigh, initial encounter: Secondary | ICD-10-CM | POA: Diagnosis present

## 2024-01-16 NOTE — ED Provider Notes (Signed)
 [facility] Provider Note   CSN: [id number] Arrival date & time: 01/16/24  1034     Patient presents with: bruise   Tiffany Benson is a 58 y.o. female with past medical history significant for asthma, migraines, does not take a blood thinner who presents with concern for irritated dark area on left upper thigh.  Patient reports that she was plucking hairs in the groin/upper thigh area and thinks she may have irritated something.  Dark mark noted just today.  Mild tenderness to palpation.  No fever, chills.  No active bleeding.  No purulent drainage.   HPI     Prior to Admission medications   Medication Sig Start Date End Date Taking? Authorizing Provider  acetaminophen  (TYLENOL ) 500 MG tablet Take 500 mg by mouth every 6 (six) hours as needed (cramps).    [provider]  albuterol  (PROVENTIL  HFA;VENTOLIN  HFA) 108 (90 BASE) MCG/ACT inhaler Inhale 2 puffs into the lungs every 6 (six) hours as needed for wheezing.    [provider]  albuterol  (VENTOLIN  HFA) 108 (90 Base) MCG/ACT inhaler inhale 2 puffs every 4-6 hours as needed for wheezing, coughing, and shortness of breath. 12/23/20   Cheryl Reusing, FNP  budesonide -formoterol  (SYMBICORT ) 160-4.5 MCG/ACT inhaler Inhale two puffs twice daily with spacer. Rinse mouth after use. 12/23/20   Cheryl Reusing, FNP  buPROPion (WELLBUTRIN SR) 200 MG 12 hr tablet Take 200 mg by mouth 2 (two) times daily. 06/03/20   [provider]  cetirizine  (ZYRTEC ) 10 MG tablet Take 1 tablet (10 mg total) by mouth daily. 12/23/20   Cheryl Reusing, FNP  clonazePAM (KLONOPIN) 2 MG tablet  11/20/16   [provider]  Fe Fum-FePoly-Vit C-Vit B3 (INTEGRA) 62.5-62.5-40-3 MG CAPS Take 1 capsule by mouth 2 (two) times daily.    [provider]  fluticasone  (FLONASE ) 50 MCG/ACT nasal spray Place 2 sprays into both nostrils daily. 12/23/20   Cheryl Reusing, FNP  hydrOXYzine  (ATARAX/VISTARIL) 25 MG tablet hydroxyzine HCl 25 mg tablet    [provider]  methocarbamol  (ROBAXIN ) 500 MG tablet Take 2 tablets (1,000 mg total) by mouth 4 (four) times daily. 07/08/18   Desiderio Chew, PA-C  naproxen  (NAPROSYN ) 500 MG tablet Take 1 tablet (500 mg total) by mouth 2 (two) times daily. 07/08/18   Desiderio Chew, PA-C  pantoprazole (PROTONIX) 20 MG tablet Take by mouth. 11/06/16   [provider]  zolpidem (AMBIEN) 10 MG tablet Take 10 mg by mouth at bedtime as needed for sleep.    [provider]    Allergies: Patient has no known allergies.    Review of Systems  All other systems reviewed and are negative.   Updated Vital Signs BP (!) 152/91 (BP Location: Right Arm)   Temp 98.2 F (36.8 C) (Oral)   Resp 18   Ht 5' 5 (1.651 m)   Wt 99.8 kg   SpO2 97%   BMI 36.61 kg/m   Physical Exam Vitals and nursing note reviewed.  Constitutional:      General: She is not in acute distress.    Appearance: Normal appearance.  HENT:     Head: Normocephalic and atraumatic.  Eyes:     General:        Right eye: No discharge.        Left eye: No discharge.  Cardiovascular:     Rate and Rhythm: Normal rate and regular rhythm.  Pulmonary:     Effort: Pulmonary  effort is normal. No respiratory distress.  Musculoskeletal:        General: No deformity.  Skin:    General: Skin is warm and dry.     Comments: Around 2 cm hematoma with mild tenderness palpation noted on left upper thigh.  No evidence of secondary infection.  Neurological:     Mental Status: She is alert and oriented to person, place, and time.  Psychiatric:        Mood and Affect: Mood normal.        Behavior: Behavior normal.     (all labs ordered are listed, but only abnormal results are displayed) Labs Reviewed - No data to display  EKG: None  Radiology: No results found.   Procedures   Medications Ordered in the ED - No data to display                                   Medical Decision Making  This patient is a 58 y.o. female who presents to the ED for concern of bruise of left upper thigh vs other lesion.   Differential diagnoses prior to evaluation: Hematoma, mole, melanoma, bruise, infection, vs other  Past Medical History / Social History / Additional history: Chart reviewed. Pertinent results include: overall noncontributory  Physical Exam: Physical exam performed. The pertinent findings include: Around 2 cm hematoma with mild tenderness palpation noted on left upper thigh.  No evidence of secondary infection.   Medications / Treatment: Discussed patient can apply Neosporin, Vaseline, or other barrier cream to the affected area, monitor for signs of infection, otherwise should be self-limited and resolve on its own.   Disposition: After consideration of the diagnostic results and the patients response to treatment, I feel that patient is stable for discharge with plan as above .   emergency department workup does not suggest an emergent condition requiring admission or immediate intervention beyond what has been performed at this time. The plan is: as above. The patient is safe for discharge and has been instructed to return immediately for worsening symptoms, change in symptoms or any other concerns.   Final diagnoses:  Hematoma of left thigh, initial encounter    ED Discharge Orders     None          Rosan Sherlean DEL, PA-C 01/16/24 1055    Elnor Jayson LABOR, DO 01/26/24 [phone]

## 2024-01-16 NOTE — ED Triage Notes (Signed)
 Pt states that she has a bruised area to her left inner thigh. States that she just noted it and wanted it evaluated. States pain is minimal.

## 2024-01-16 NOTE — Discharge Instructions (Signed)
 As we discussed from my exam I do see a little bleeding under the surface from where you are plucking hairs.  You can apply some Neosporin up to twice daily to the affected area to ensure that it does not become infected, otherwise the bruising should resolve on its own over the next 1 to 2 weeks.  If the bruises not resolving, worsening, or changing, especially becomes red, irritated, or increasingly painful I recommend further evaluation with PCP or dermatologist.  If you are concerned for significant worsening infection please return to the emergency department for further evaluation.
